# Patient Record
Sex: Female | Born: 1985 | Hispanic: Yes | Marital: Single | Smoking: Never smoker
Health system: Southern US, Community
[De-identification: ages and names within clinical notes are randomized; demographics above are authoritative.]

## PROBLEM LIST (undated history)

## (undated) DIAGNOSIS — R87619 Unspecified abnormal cytological findings in specimens from cervix uteri: Secondary | ICD-10-CM

## (undated) HISTORY — PX: COLPOSCOPY: SHX161

## (undated) HISTORY — DX: Unspecified abnormal cytological findings in specimens from cervix uteri: R87.619

---

## 2009-07-24 ENCOUNTER — Emergency Department: Payer: Self-pay | Admitting: Emergency Medicine

## 2010-06-06 ENCOUNTER — Emergency Department: Payer: Self-pay | Admitting: Emergency Medicine

## 2010-06-07 ENCOUNTER — Emergency Department: Payer: Self-pay | Admitting: Unknown Physician Specialty

## 2011-09-08 IMAGING — CT CT HEAD WITHOUT CONTRAST
2 series · 16 of 30 positions shown, 20 images · non-contrast
Comparison: none

REASON FOR EXAM: trauma
COMMENTS:

PROCEDURE:     CT  - CT HEAD WITHOUT CONTRAST  - June 06, 2010  [DATE]
RESULT:     Comparison:  None
TECHNIQUE: Multiple axial images from the foramen magnum to the vertex were
obtained without IV contrast.

[Series 2: without · axial · non-contrast · 0.40mm/px · z∈[+462,+582]mm · 13 of 30 slices shown, 17 images]
[im 3/30  brain]
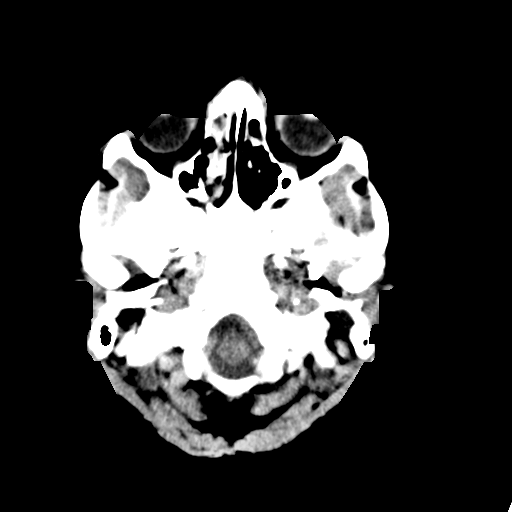
[im 3/30  bone]
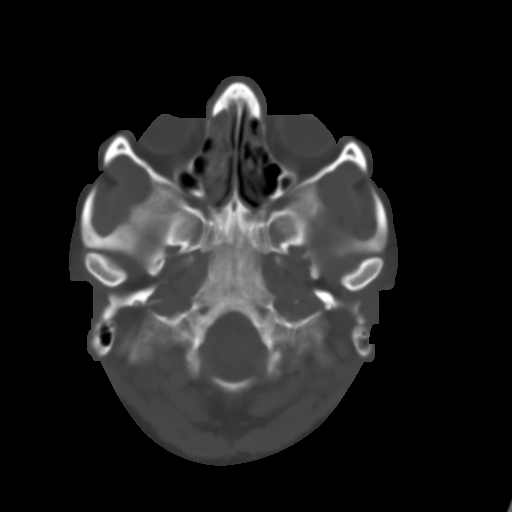
[im 5/30  brain]
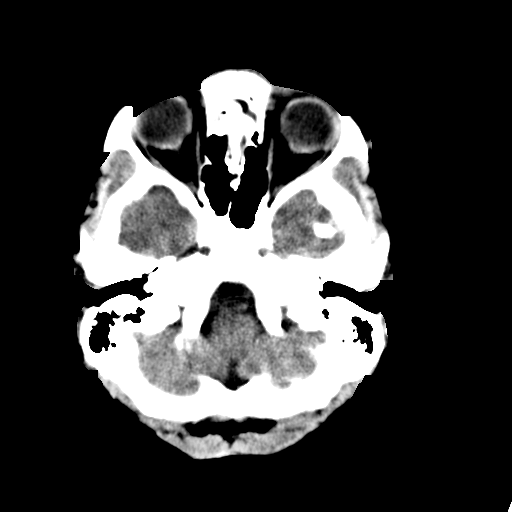
[im 7/30  brain]
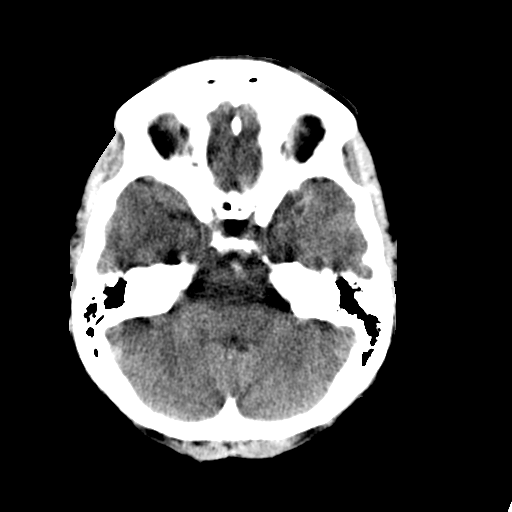
[im 9/30  brain]
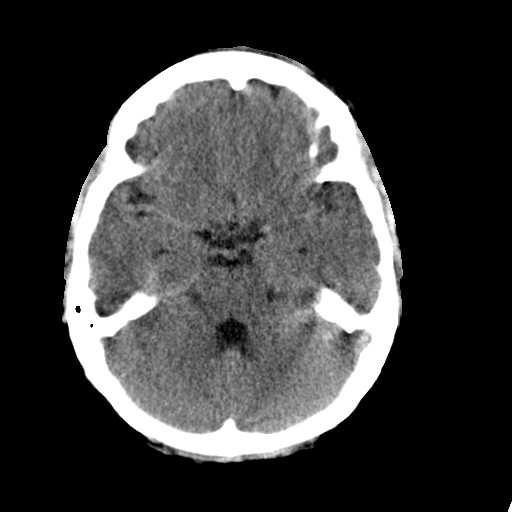
[im 11/30  brain]
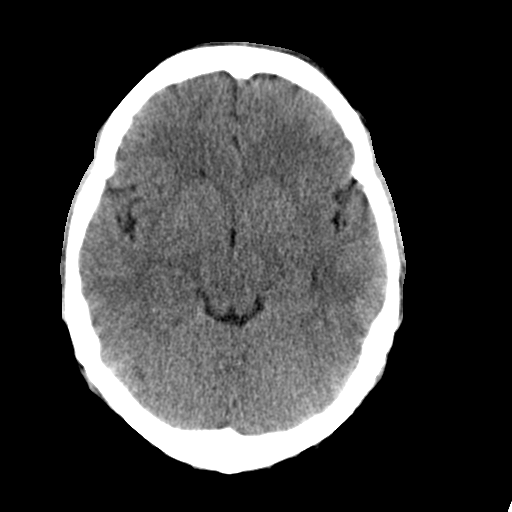
[im 11/30  bone]
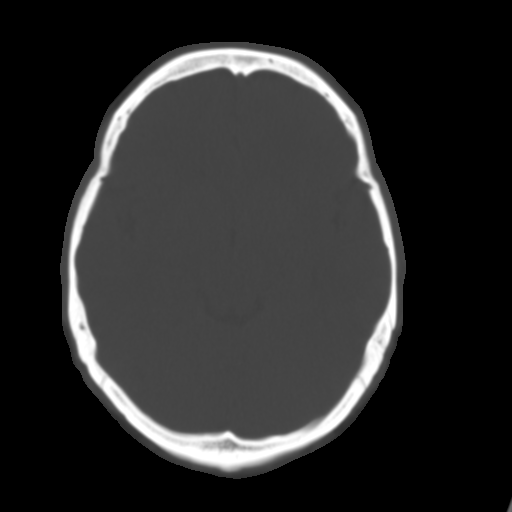
[im 13/30  brain]
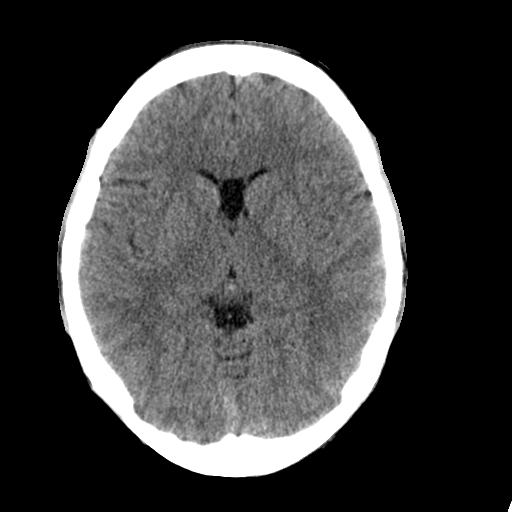
[im 15/30  brain]
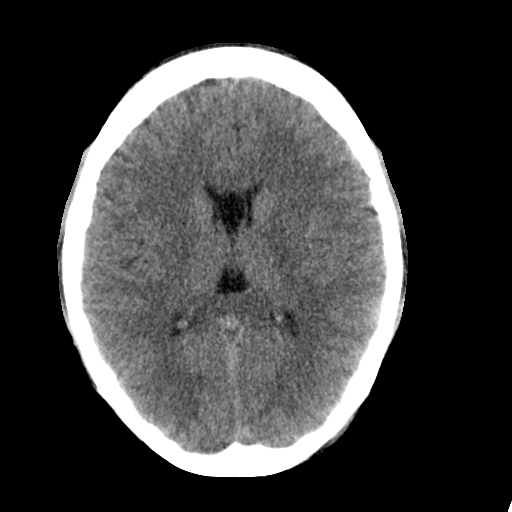
[im 17/30  brain]
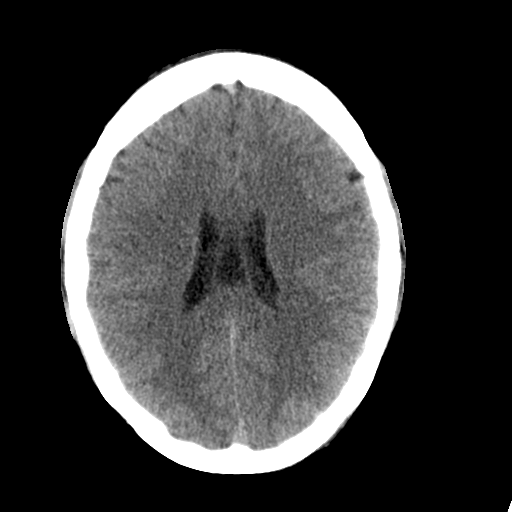
[im 19/30  brain]
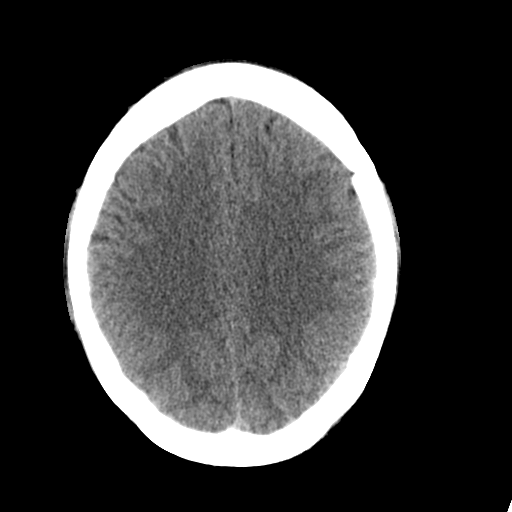
[im 19/30  bone]
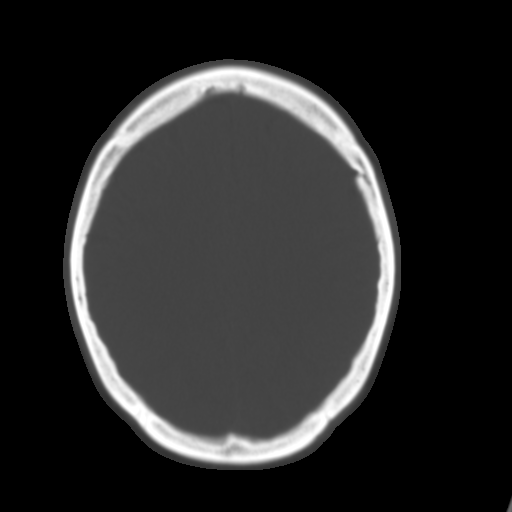
[im 21/30  brain]
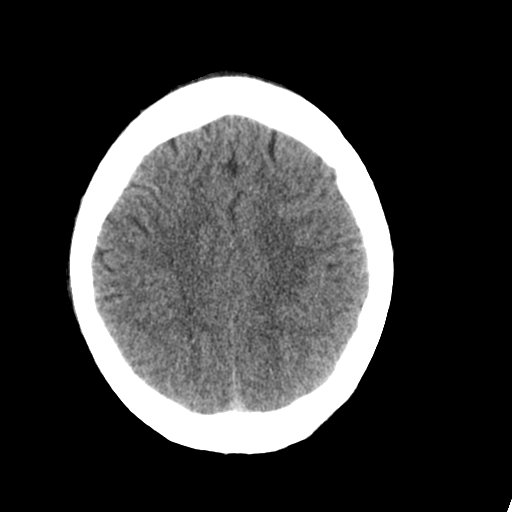
[im 23/30  brain]
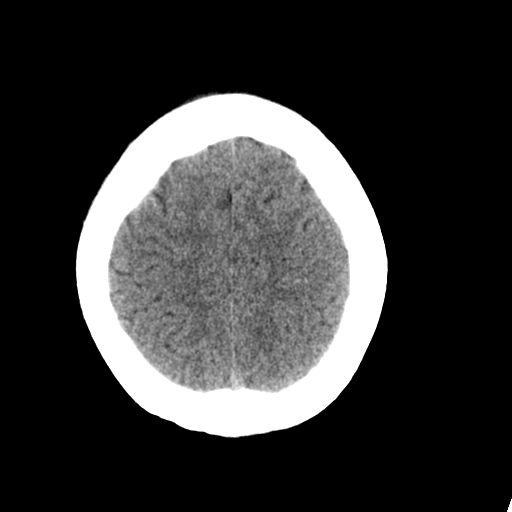
[im 25/30  brain]
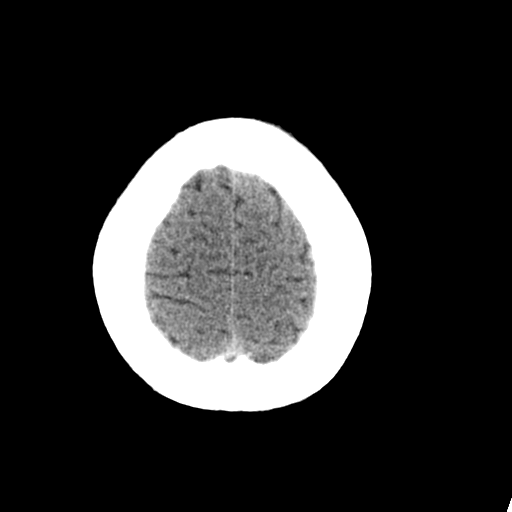
[im 27/30  brain]
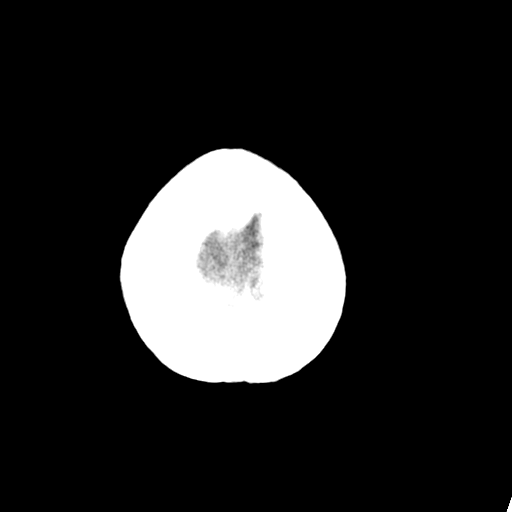
[im 27/30  bone]
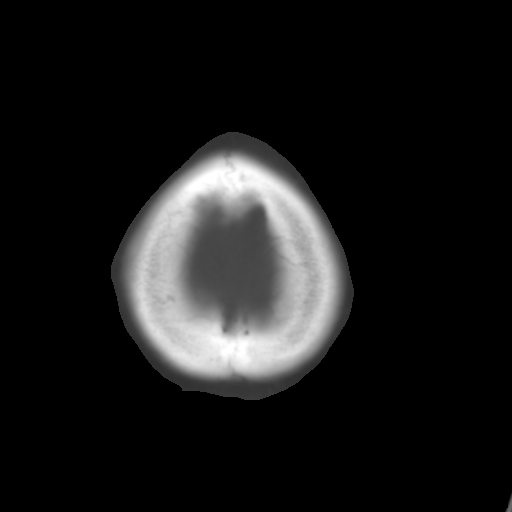

[Series 3: bone · axial · 0.40mm/px · z∈[+462,+502]mm · 3 of 30 slices shown]
[im 3/30  bone]
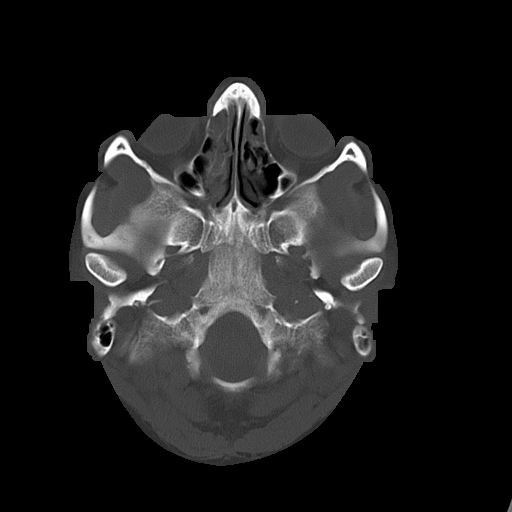
[im 7/30  bone]
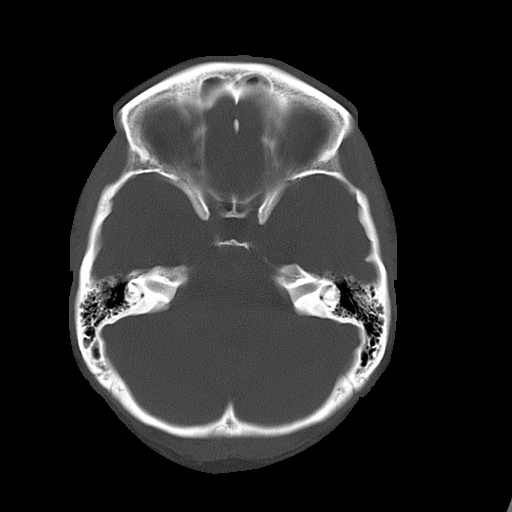
[im 11/30  bone]
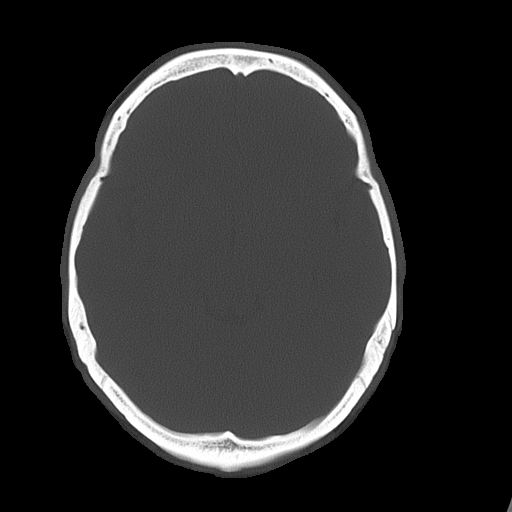

[16 of 30 positions shown; findings below may reference images not displayed]

FINDINGS: There is no evidence of mass effect, midline shift, or extra-axial fluid
collections.  There is no evidence of a space-occupying lesion or
intracranial hemorrhage. There is no evidence of a cortical-based area of
acute infarction.

The ventricles and sulci are appropriate for the patient's age. The basal
cisterns are patent.

Visualized portions of the orbits are unremarkable. There is bilateral
maxillary sinus , ethmoid sinus and frontal sinus mucosal thickening.

The osseous structures are unremarkable. There is a frontal scalp contusion.
IMPRESSION: No acute intracranial process.

Sinus disease as described above.

## 2014-02-12 ENCOUNTER — Observation Stay: Payer: Self-pay | Admitting: Obstetrics and Gynecology

## 2014-02-12 LAB — PIH PROFILE
Anion Gap: 11 (ref 7–16)
BUN: 6 mg/dL — ABNORMAL LOW (ref 7–18)
CHLORIDE: 106 mmol/L (ref 98–107)
Calcium, Total: 8.4 mg/dL — ABNORMAL LOW (ref 8.5–10.1)
Co2: 24 mmol/L (ref 21–32)
Creatinine: 0.54 mg/dL — ABNORMAL LOW (ref 0.60–1.30)
GLUCOSE: 118 mg/dL — AB (ref 65–99)
HCT: 27.4 % — AB (ref 35.0–47.0)
HGB: 9 g/dL — AB (ref 12.0–16.0)
MCH: 28.3 pg (ref 26.0–34.0)
MCHC: 32.9 g/dL (ref 32.0–36.0)
MCV: 86 fL (ref 80–100)
Osmolality: 280 (ref 275–301)
Platelet: 222 10*3/uL (ref 150–440)
Potassium: 3.9 mmol/L (ref 3.5–5.1)
RBC: 3.19 10*6/uL — ABNORMAL LOW (ref 3.80–5.20)
RDW: 14 % (ref 11.5–14.5)
SGOT(AST): 18 U/L (ref 15–37)
Sodium: 141 mmol/L (ref 136–145)
Uric Acid: 3.2 mg/dL (ref 2.6–6.0)
WBC: 8.2 10*3/uL (ref 3.6–11.0)

## 2014-02-12 LAB — PROTEIN / CREATININE RATIO, URINE
Creatinine, Urine: 106.8 mg/dL (ref 30.0–125.0)
PROTEIN, RANDOM URINE: 47 mg/dL — AB (ref 0–12)
PROTEIN/CREAT. RATIO: 440 mg/g{creat} — AB (ref 0–200)

## 2014-02-22 LAB — PIH PROFILE
Anion Gap: 7 (ref 7–16)
BUN: 6 mg/dL — ABNORMAL LOW (ref 7–18)
Calcium, Total: 8.1 mg/dL — ABNORMAL LOW (ref 8.5–10.1)
Chloride: 109 mmol/L — ABNORMAL HIGH (ref 98–107)
Co2: 24 mmol/L (ref 21–32)
Creatinine: 0.55 mg/dL — ABNORMAL LOW (ref 0.60–1.30)
EGFR (African American): >60
EGFR (Non-African Amer.): >60
Glucose: 104 mg/dL — ABNORMAL HIGH (ref 65–99)
HCT: 27.5 % — AB (ref 35.0–47.0)
HGB: 8.9 g/dL — ABNORMAL LOW (ref 12.0–16.0)
MCH: 27.7 pg (ref 26.0–34.0)
MCHC: 32.4 g/dL (ref 32.0–36.0)
MCV: 86 fL (ref 80–100)
Osmolality: 277 (ref 275–301)
Platelet: 219 10*3/uL (ref 150–440)
Potassium: 3.5 mmol/L (ref 3.5–5.1)
RBC: 3.22 10*6/uL — ABNORMAL LOW (ref 3.80–5.20)
RDW: 14.4 % (ref 11.5–14.5)
SGOT(AST): 22 U/L (ref 15–37)
Sodium: 140 mmol/L (ref 136–145)
Uric Acid: 4.4 mg/dL (ref 2.6–6.0)
WBC: 7.3 10*3/uL (ref 3.6–11.0)

## 2014-02-22 LAB — PROTEIN / CREATININE RATIO, URINE
Creatinine, Urine: 67.3 mg/dL (ref 30.0–125.0)
PROTEIN, RANDOM URINE: 134 mg/dL — AB (ref 0–12)
Protein/Creat. Ratio: 1991 mg/gCREAT — ABNORMAL HIGH (ref 0–200)

## 2014-02-24 LAB — PROTEIN, URINE, 24 HOUR
COLLECTION HOURS: 24 h
PROTEIN, URINE: 194 mg/dL (ref 0–12)
Protein, 24 Hour Urine: 2619 mg/24HR — ABNORMAL HIGH (ref 30–149)
Total Volume: 1350 mL

## 2014-02-25 LAB — PIH PROFILE
ANION GAP: 10 (ref 7–16)
BUN: 7 mg/dL (ref 7–18)
Calcium, Total: 8.5 mg/dL (ref 8.5–10.1)
Chloride: 105 mmol/L (ref 98–107)
Co2: 22 mmol/L (ref 21–32)
Creatinine: 0.52 mg/dL — ABNORMAL LOW (ref 0.60–1.30)
EGFR (African American): >60
EGFR (Non-African Amer.): >60
GLUCOSE: 107 mg/dL — AB (ref 65–99)
HCT: 27 % — AB (ref 35.0–47.0)
HGB: 8.5 g/dL — AB (ref 12.0–16.0)
MCH: 27 pg (ref 26.0–34.0)
MCHC: 31.3 g/dL — ABNORMAL LOW (ref 32.0–36.0)
MCV: 86 fL (ref 80–100)
Osmolality: 272 (ref 275–301)
Platelet: 224 10*3/uL (ref 150–440)
Potassium: 4 mmol/L (ref 3.5–5.1)
RBC: 3.14 10*6/uL — ABNORMAL LOW (ref 3.80–5.20)
RDW: 14.5 % (ref 11.5–14.5)
SGOT(AST): 14 U/L — ABNORMAL LOW (ref 15–37)
SODIUM: 137 mmol/L (ref 136–145)
Uric Acid: 3.5 mg/dL (ref 2.6–6.0)
WBC: 11.4 10*3/uL — ABNORMAL HIGH (ref 3.6–11.0)

## 2014-02-25 LAB — LACTATE DEHYDROGENASE: LDH: 204 U/L (ref 81–246)

## 2014-02-26 ENCOUNTER — Inpatient Hospital Stay: Payer: Self-pay | Admitting: Obstetrics and Gynecology

## 2014-02-26 LAB — SGOT (AST)(ARMC): SGOT(AST): 17 U/L (ref 15–37)

## 2014-02-26 LAB — BASIC METABOLIC PANEL
Anion Gap: 9 (ref 7–16)
BUN: 8 mg/dL (ref 7–18)
CREATININE: 0.49 mg/dL — AB (ref 0.60–1.30)
Calcium, Total: 8.5 mg/dL (ref 8.5–10.1)
Chloride: 104 mmol/L (ref 98–107)
Co2: 24 mmol/L (ref 21–32)
EGFR (African American): >60
GLUCOSE: 85 mg/dL (ref 65–99)
OSMOLALITY: 271 (ref 275–301)
Potassium: 4.3 mmol/L (ref 3.5–5.1)
SODIUM: 137 mmol/L (ref 136–145)

## 2014-02-26 LAB — IRON AND TIBC
Iron Bind.Cap.(Total): 504 ug/dL — ABNORMAL HIGH (ref 250–450)
Iron Saturation: 11 %
Iron: 55 ug/dL (ref 50–170)
Unbound Iron-Bind.Cap.: 449 ug/dL

## 2014-02-26 LAB — WBC: WBC: 8.1 10*3/uL (ref 3.6–11.0)

## 2014-02-26 LAB — RBC: RBC: 3.37 10*6/uL — AB (ref 3.80–5.20)

## 2014-02-26 LAB — PLATELET COUNT: PLATELETS: 220 10*3/uL (ref 150–440)

## 2014-02-26 LAB — FERRITIN: Ferritin (ARMC): 5 ng/mL — ABNORMAL LOW (ref 8–388)

## 2014-02-26 LAB — HEMATOCRIT: HCT: 29.3 % — ABNORMAL LOW (ref 35.0–47.0)

## 2014-02-26 LAB — HEMOGLOBIN: HGB: 9.3 g/dL — AB (ref 12.0–16.0)

## 2014-02-26 LAB — URIC ACID: URIC ACID: 3.3 mg/dL (ref 2.6–6.0)

## 2014-02-28 LAB — COMPREHENSIVE METABOLIC PANEL
ALBUMIN: 2.2 g/dL — AB (ref 3.4–5.0)
Alkaline Phosphatase: 122 U/L — ABNORMAL HIGH
Anion Gap: 6 — ABNORMAL LOW (ref 7–16)
BUN: 10 mg/dL (ref 7–18)
Bilirubin,Total: 0.1 mg/dL — ABNORMAL LOW (ref 0.2–1.0)
CO2: 27 mmol/L (ref 21–32)
CREATININE: 0.55 mg/dL — AB (ref 0.60–1.30)
Calcium, Total: 8.7 mg/dL (ref 8.5–10.1)
Chloride: 105 mmol/L (ref 98–107)
EGFR (Non-African Amer.): >60
GLUCOSE: 123 mg/dL — AB (ref 65–99)
Osmolality: 276 (ref 275–301)
Potassium: 3.9 mmol/L (ref 3.5–5.1)
SGOT(AST): 18 U/L (ref 15–37)
SGPT (ALT): 22 U/L
SODIUM: 138 mmol/L (ref 136–145)
Total Protein: 6.1 g/dL — ABNORMAL LOW (ref 6.4–8.2)

## 2014-02-28 LAB — CBC WITH DIFFERENTIAL/PLATELET
Basophil #: 0 10*3/uL (ref 0.0–0.1)
Basophil %: 0.4 %
EOS PCT: 1.2 %
Eosinophil #: 0.1 10*3/uL (ref 0.0–0.7)
HCT: 31.7 % — ABNORMAL LOW (ref 35.0–47.0)
HGB: 10.3 g/dL — AB (ref 12.0–16.0)
LYMPHS ABS: 1.9 10*3/uL (ref 1.0–3.6)
Lymphocyte %: 21.5 %
MCH: 28.1 pg (ref 26.0–34.0)
MCHC: 32.6 g/dL (ref 32.0–36.0)
MCV: 86 fL (ref 80–100)
MONOS PCT: 4.7 %
Monocyte #: 0.4 x10 3/mm (ref 0.2–0.9)
NEUTROS ABS: 6.5 10*3/uL (ref 1.4–6.5)
NEUTROS PCT: 72.2 %
PLATELETS: 219 10*3/uL (ref 150–440)
RBC: 3.68 10*6/uL — ABNORMAL LOW (ref 3.80–5.20)
RDW: 14.9 % — AB (ref 11.5–14.5)
WBC: 9 10*3/uL (ref 3.6–11.0)

## 2014-02-28 LAB — PROTEIN, URINE, 24 HOUR
COLLECTION HOURS: 24 h
Protein, 24 Hour Urine: 6250 mg/24HR — ABNORMAL HIGH (ref 30–149)
Protein, Urine: 250 mg/dL (ref 0–12)
TOTAL VOLUME C4TV(ML): 2500 mL

## 2014-03-01 LAB — CBC WITH DIFFERENTIAL/PLATELET
BASOS PCT: 0.2 %
Basophil #: 0 10*3/uL (ref 0.0–0.1)
Eosinophil #: 0.1 10*3/uL (ref 0.0–0.7)
Eosinophil %: 1.2 %
HCT: 30.1 % — ABNORMAL LOW (ref 35.0–47.0)
HGB: 9.6 g/dL — ABNORMAL LOW (ref 12.0–16.0)
Lymphocyte #: 2 10*3/uL (ref 1.0–3.6)
Lymphocyte %: 21.8 %
MCH: 27.7 pg (ref 26.0–34.0)
MCHC: 32 g/dL (ref 32.0–36.0)
MCV: 87 fL (ref 80–100)
Monocyte #: 0.6 x10 3/mm (ref 0.2–0.9)
Monocyte %: 6.4 %
NEUTROS PCT: 70.4 %
Neutrophil #: 6.5 10*3/uL (ref 1.4–6.5)
PLATELETS: 204 10*3/uL (ref 150–440)
RBC: 3.47 10*6/uL — AB (ref 3.80–5.20)
RDW: 14.7 % — AB (ref 11.5–14.5)
WBC: 9.2 10*3/uL (ref 3.6–11.0)

## 2014-03-01 LAB — COMPREHENSIVE METABOLIC PANEL
ALK PHOS: 117 U/L — AB
Albumin: 1.9 g/dL — ABNORMAL LOW (ref 3.4–5.0)
Anion Gap: 10 (ref 7–16)
BILIRUBIN TOTAL: 0.2 mg/dL (ref 0.2–1.0)
BUN: 9 mg/dL (ref 7–18)
CHLORIDE: 105 mmol/L (ref 98–107)
CREATININE: 0.48 mg/dL — AB (ref 0.60–1.30)
Calcium, Total: 8.4 mg/dL — ABNORMAL LOW (ref 8.5–10.1)
Co2: 23 mmol/L (ref 21–32)
EGFR (Non-African Amer.): >60
Glucose: 101 mg/dL — ABNORMAL HIGH (ref 65–99)
Osmolality: 275 (ref 275–301)
Potassium: 4.4 mmol/L (ref 3.5–5.1)
SGOT(AST): 19 U/L (ref 15–37)
SGPT (ALT): 21 U/L
Sodium: 138 mmol/L (ref 136–145)
TOTAL PROTEIN: 5.7 g/dL — AB (ref 6.4–8.2)

## 2014-03-02 LAB — COMPREHENSIVE METABOLIC PANEL
ALK PHOS: 123 U/L — AB
ALT: 25 U/L
ANION GAP: 9 (ref 7–16)
AST: 26 U/L (ref 15–37)
Albumin: 2 g/dL — ABNORMAL LOW (ref 3.4–5.0)
BUN: 10 mg/dL (ref 7–18)
Bilirubin,Total: 0.1 mg/dL — ABNORMAL LOW (ref 0.2–1.0)
CALCIUM: 8.4 mg/dL — AB (ref 8.5–10.1)
CO2: 23 mmol/L (ref 21–32)
CREATININE: 0.58 mg/dL — AB (ref 0.60–1.30)
Chloride: 105 mmol/L (ref 98–107)
EGFR (African American): >60
GLUCOSE: 91 mg/dL (ref 65–99)
Osmolality: 272 (ref 275–301)
Potassium: 4 mmol/L (ref 3.5–5.1)
Sodium: 137 mmol/L (ref 136–145)
Total Protein: 6.1 g/dL — ABNORMAL LOW (ref 6.4–8.2)

## 2014-03-02 LAB — CBC WITH DIFFERENTIAL/PLATELET
BASOS ABS: 0 10*3/uL (ref 0.0–0.1)
Basophil %: 0.3 %
EOS PCT: 1.4 %
Eosinophil #: 0.1 10*3/uL (ref 0.0–0.7)
HCT: 30.6 % — ABNORMAL LOW (ref 35.0–47.0)
HGB: 10 g/dL — ABNORMAL LOW (ref 12.0–16.0)
LYMPHS ABS: 2 10*3/uL (ref 1.0–3.6)
LYMPHS PCT: 23.9 %
MCH: 28.1 pg (ref 26.0–34.0)
MCHC: 32.5 g/dL (ref 32.0–36.0)
MCV: 87 fL (ref 80–100)
MONO ABS: 0.6 x10 3/mm (ref 0.2–0.9)
Monocyte %: 7 %
NEUTROS ABS: 5.5 10*3/uL (ref 1.4–6.5)
NEUTROS PCT: 67.4 %
PLATELETS: 214 10*3/uL (ref 150–440)
RBC: 3.54 10*6/uL — AB (ref 3.80–5.20)
RDW: 14.9 % — ABNORMAL HIGH (ref 11.5–14.5)
WBC: 8.2 10*3/uL (ref 3.6–11.0)

## 2014-03-03 LAB — CBC WITH DIFFERENTIAL/PLATELET
BASOS PCT: 0.3 %
Basophil #: 0 10*3/uL (ref 0.0–0.1)
Basophil #: 0 10*3/uL (ref 0.0–0.1)
Basophil %: 0.2 %
EOS ABS: 0.1 10*3/uL (ref 0.0–0.7)
EOS ABS: 0.1 10*3/uL (ref 0.0–0.7)
EOS PCT: 1 %
Eosinophil %: 0.5 %
HCT: 31.2 % — ABNORMAL LOW (ref 35.0–47.0)
HCT: 30.5 % — AB (ref 35.0–47.0)
HGB: 10 g/dL — ABNORMAL LOW (ref 12.0–16.0)
HGB: 10.2 g/dL — ABNORMAL LOW (ref 12.0–16.0)
LYMPHS ABS: 1.7 10*3/uL (ref 1.0–3.6)
LYMPHS PCT: 15.8 %
Lymphocyte #: 1.9 10*3/uL (ref 1.0–3.6)
Lymphocyte %: 15.7 %
MCH: 28.1 pg (ref 26.0–34.0)
MCH: 28.1 pg (ref 26.0–34.0)
MCHC: 32.7 g/dL (ref 32.0–36.0)
MCHC: 32.8 g/dL (ref 32.0–36.0)
MCV: 86 fL (ref 80–100)
MCV: 86 fL (ref 80–100)
Monocyte #: 0.7 x10 3/mm (ref 0.2–0.9)
Monocyte #: 0.7 x10 3/mm (ref 0.2–0.9)
Monocyte %: 5.8 %
Monocyte %: 6.4 %
NEUTROS ABS: 8.4 10*3/uL — AB (ref 1.4–6.5)
NEUTROS ABS: 9.2 10*3/uL — AB (ref 1.4–6.5)
Neutrophil %: 77.2 %
Neutrophil %: 77.1 %
PLATELETS: 224 10*3/uL (ref 150–440)
Platelet: 215 10*3/uL (ref 150–440)
RBC: 3.62 10*6/uL — AB (ref 3.80–5.20)
RBC: 3.56 10*6/uL — AB (ref 3.80–5.20)
RDW: 15.2 % — ABNORMAL HIGH (ref 11.5–14.5)
RDW: 15 % — AB (ref 11.5–14.5)
WBC: 11.9 10*3/uL — ABNORMAL HIGH (ref 3.6–11.0)
WBC: 10.9 10*3/uL (ref 3.6–11.0)

## 2014-03-03 LAB — COMPREHENSIVE METABOLIC PANEL
ALBUMIN: 2 g/dL — AB (ref 3.4–5.0)
ALT: 31 U/L
Albumin: 2 g/dL — ABNORMAL LOW (ref 3.4–5.0)
Alkaline Phosphatase: 118 U/L — ABNORMAL HIGH
Alkaline Phosphatase: 131 U/L — ABNORMAL HIGH
Anion Gap: 12 (ref 7–16)
Anion Gap: 7 (ref 7–16)
BILIRUBIN TOTAL: 0.3 mg/dL (ref 0.2–1.0)
BUN: 7 mg/dL (ref 7–18)
BUN: 7 mg/dL (ref 7–18)
Bilirubin,Total: 0.1 mg/dL — ABNORMAL LOW (ref 0.2–1.0)
CALCIUM: 7.9 mg/dL — AB (ref 8.5–10.1)
CO2: 23 mmol/L (ref 21–32)
CREATININE: 0.63 mg/dL (ref 0.60–1.30)
Calcium, Total: 7.4 mg/dL — ABNORMAL LOW (ref 8.5–10.1)
Chloride: 103 mmol/L (ref 98–107)
Chloride: 101 mmol/L (ref 98–107)
Co2: 24 mmol/L (ref 21–32)
Creatinine: 0.59 mg/dL — ABNORMAL LOW (ref 0.60–1.30)
EGFR (African American): >60
EGFR (Non-African Amer.): >60
EGFR (Non-African Amer.): >60
GLUCOSE: 90 mg/dL (ref 65–99)
Glucose: 92 mg/dL (ref 65–99)
OSMOLALITY: 262 (ref 275–301)
Osmolality: 273 (ref 275–301)
POTASSIUM: 4.1 mmol/L (ref 3.5–5.1)
POTASSIUM: 4 mmol/L (ref 3.5–5.1)
SGOT(AST): 25 U/L (ref 15–37)
SGOT(AST): 21 U/L (ref 15–37)
SGPT (ALT): 29 U/L
Sodium: 138 mmol/L (ref 136–145)
Sodium: 132 mmol/L — ABNORMAL LOW (ref 136–145)
TOTAL PROTEIN: 6 g/dL — AB (ref 6.4–8.2)
Total Protein: 6.1 g/dL — ABNORMAL LOW (ref 6.4–8.2)

## 2014-03-04 LAB — COMPREHENSIVE METABOLIC PANEL
Albumin: 1.6 g/dL — ABNORMAL LOW (ref 3.4–5.0)
Alkaline Phosphatase: 103 U/L
Anion Gap: 8 (ref 7–16)
BUN: 8 mg/dL (ref 7–18)
Bilirubin,Total: 0.3 mg/dL (ref 0.2–1.0)
CALCIUM: 7.5 mg/dL — AB (ref 8.5–10.1)
Chloride: 102 mmol/L (ref 98–107)
Co2: 25 mmol/L (ref 21–32)
Creatinine: 0.64 mg/dL (ref 0.60–1.30)
GLUCOSE: 94 mg/dL (ref 65–99)
Osmolality: 268 (ref 275–301)
Potassium: 4.8 mmol/L (ref 3.5–5.1)
SGOT(AST): 30 U/L (ref 15–37)
SGPT (ALT): 28 U/L
SODIUM: 135 mmol/L — AB (ref 136–145)
Total Protein: 5.1 g/dL — ABNORMAL LOW (ref 6.4–8.2)

## 2014-03-04 LAB — CBC WITH DIFFERENTIAL/PLATELET
Basophil #: 0 10*3/uL (ref 0.0–0.1)
Basophil %: 0.2 %
EOS PCT: 0.5 %
Eosinophil #: 0 10*3/uL (ref 0.0–0.7)
HCT: 29.2 % — ABNORMAL LOW (ref 35.0–47.0)
HGB: 9.7 g/dL — ABNORMAL LOW (ref 12.0–16.0)
LYMPHS ABS: 1.7 10*3/uL (ref 1.0–3.6)
Lymphocyte %: 17.2 %
MCH: 28.6 pg (ref 26.0–34.0)
MCHC: 33.3 g/dL (ref 32.0–36.0)
MCV: 86 fL (ref 80–100)
MONO ABS: 0.7 x10 3/mm (ref 0.2–0.9)
MONOS PCT: 7.1 %
Neutrophil #: 7.4 10*3/uL — ABNORMAL HIGH (ref 1.4–6.5)
Neutrophil %: 75 %
Platelet: 205 10*3/uL (ref 150–440)
RBC: 3.4 10*6/uL — AB (ref 3.80–5.20)
RDW: 15.5 % — ABNORMAL HIGH (ref 11.5–14.5)
WBC: 9.8 10*3/uL (ref 3.6–11.0)

## 2014-03-08 LAB — PATHOLOGY REPORT

## 2014-10-19 NOTE — Consult Note (Signed)
Referral Information:  Reason for Referral 29 yo gravida 3 para 2002 at 49w0dgestation by LMP and 129w5dSKoreas referred by her inpatient team for consultation regarding further management of her preeclampsia.  [facility]   Referring Physician [facility]   Prenatal Hx Uncomplicated PN course until two weeks ago when she developed swelling.  Her BP on 02/12/2014 was normotensive but higher than it had been previously (124/76), and she now had 2+ protein.  On 02/22/2014, she presented to the [facility] with a BP of  140/90 and 3+ protein.  Dr[facility] JaGlennon Macas [facility].  Since admission, she has denied any preeclampsia symptoms, although today she says her distance vision is blurry.  Her BPs have ranged from  131-149/79-88 since admission  A 24 hour urine for protein resulted on 02/24/2014 was 2619 mg/24 hours.  SGOT was normal today, creatinine was 0.52.  Her CBCs have been normal.  Her last platelet count was  224,000 today.  She has received betamethasone x 2.   Past Obstetrical Hx 1.01/22/2003 [place] with preeclampsia.   2. 06/[facility]   The father of this fetus is not the same as the father of her other children   Home Medications: Medication Instructions Status  PreNata Prenatal Multivitamins oral tablet 1 tab(s) orally once a day Active   Allergies:   Aspirin: Hives  Vital Signs/Notes:  Nursing Vital Signs: **Vital Signs.:   31-Aug-15 04:15  Vital Signs Type Routine  Temperature Temperature (F) 98  Celsius 36.6  Temperature Source oral  Pulse Pulse 66  Respirations Respirations 16  Systolic BP Systolic BP 13403Diastolic BP (mmHg) Diastolic BP (mmHg) 82  Mean BP 98  Pulse Ox % Pulse Ox % 98  Pulse Ox Activity Level  At rest  Oxygen  Delivery Room Air/ 21 %    08:23  Vital Signs Type Routine  Temperature Temperature (F) 98  Celsius 36.6  Temperature Source oral  Pulse Pulse 70  Respirations Respirations 18  Systolic BP Systolic BP 14474Diastolic BP (mmHg) Diastolic BP (mmHg) 88  Mean BP 108  BP Source  if not from Vital Sign Device non-invasive  Pulse Ox % Pulse Ox % 97  Pulse Ox Activity Level  At rest  Oxygen Delivery Room Air/ 21 %  Fetal Heart Tones  144    11:52  Vital Signs Type Routine  Temperature Temperature (F) 98  Celsius 36.6  Temperature Source oral  Pulse Pulse 73  Respirations Respirations 18  Systolic BP Systolic BP 13259Diastolic BP (mmHg) Diastolic BP (mmHg) 82  Mean BP 99  BP Source  if not from Vital Sign Device non-invasive  Pulse Ox % Pulse Ox % 98  Pulse Ox Activity Level  At rest  Oxygen Delivery Room Air/ 21 %    16:19  Vital Signs Type Routine  Temperature Temperature (F) 98.6  Celsius 37  Temperature Source oral  Pulse Pulse 65  Respirations Respirations 16  Systolic BP Systolic BP 16563Diastolic BP (mmHg) Diastolic BP (mmHg) 99  Mean BP 119  BP Source  if not from Vital Sign Device non-invasive  Pulse Ox % Pulse Ox % 96  Pulse Ox Activity Level  At rest  Oxygen Delivery Room Air/ 21 %   Perinatal Consult:  LMP 09-Jul-2013   Past Medical History cont'd Asthma - uses inhaler prn   PSurg Hx None   FHx MGM with diabetes   Occupation Father unemployed   Soc Hx single, 2 children [place] in [place] with [place]:  Subjective As per HPI   Fever/Chills No   Cough No   Abdominal Pain No   Diarrhea No   Constipation No   Nausea/Vomiting No   SOB/DOE No   Chest Pain No   Dysuria No   Tolerating Diet No   Medications/Allergies Reviewed Medications/Allergies reviewed   Exam:  Today's Weight 187lb; BMI=34     Hepatic:  31-Aug-15 04:26   SGOT (AST)  14 (Result(s) reported on 25 Feb 2014 at 05:22AM.)  LabUnknown:  30-Aug-15 08:58    C4-UPROT 194  Routine Chem:  31-Aug-15 04:26   Uric Acid, Serum 3.5 (Result(s) reported on 25 Feb 2014 at 05:22AM.)  Glucose, Serum  107  BUN 7  Creatinine (comp)  0.52  Sodium, Serum 137  Potassium, Serum 4.0  Chloride, Serum 105  CO2, Serum 22  Osmolality (calc) 272  Anion Gap 10  Calcium (Total), Serum 8.5  eGFR (Non-African American) >60 (eGFR values <21m/min/1.73 m2 may be an indication of chronic kidney disease (CKD). Calculated eGFR is useful in patients with stable renal function. The eGFR calculation will not be reliable in acutely ill patients when serum creatinine is changing rapidly. It is not useful in  patients on dialysis. The eGFR calculation may not be applicable to patients at the low and high extremes of body sizes, pregnant women, and vegetarians.)  eGFR (African American) >60  LDH, Serum 204 (Result(s) reported on 25 Feb 2014 at 05:24AM.)  Misc Urine Chem:  30-Aug-15 08:58   Total Volume, Urine 1350  Collection Hours, Urine 24  Protein, 24 Hr Urine  2619 (Result(s) reported on 24 Feb 2014 at 09:50AM.)  Routine Hem:  31-Aug-15 04:26   WBC (CBC)  11.4  RBC (CBC)  3.14  Hemoglobin (CBC)  8.5  Hematocrit (CBC)  27.0  Platelet Count (CBC) 224 (Result(s) reported on 25 Feb 2014 at 05:22AM.)  MCV 86  MCH 27.0  MCHC  31.3  RDW 14.5    Additional Lab/Radiology Notes [place] - cephalic, EFW 16270g, 235%KKXF AFI = 12.2 cm   Impression/Recommendations:  Impression 29yo gravida 3 para 2002 at 358w0destation by LMP and 1562w5d [place] preeclampsia based on HTN and proteinuria.  Other than blurry distance vision, which is of questionable significance, she has no other signs or symptoms of preeclampsia.   Recommendations 1. Her blood pressures are sufficiently elevated above baseline and her condition has changed sufficiently over time, that I agree with inpatient management, as opposed to home bedrest. 2. Sequential compression devices antepartum and  postpartum 3. Consider postpartum thromboprophylaxis with LMWH if the patient has a cesarean or remains at bedrest > 1 week. 4. Plan delivery if she develops severe signs or symptoms - cerebral sx, pulmonary edema, SBP > 160, dias BP > 105, dropping platelets, elevated liver enzymes, rising creatinine, oliguria. 5. Otherwise, plan delivery at 37 weeks. 6. Use antihypertensives as necessary to manage BP acutely, but do not plan to treat BP chronically.  If patient requires antihypertensives, she likely needs to be delivered for worsening/uncontrolled BP. 7. Notify NICU and request they meet with patient. 8. [facility] will be available for further consultation  as requested. 9. The above recommendations were communicated to Dr. Ilda Basset. 10.  The patient was counseled via the interpreter regarding our recommendatiosn.   Plan:  Comment/Plan Thank you for allowing [place] to participate in her care    Total Time Spent with Patient 60 minutes   >50% of visit spent in couseling/coordination of care yes   Office Use Only [facility] Detailed (55 min)   Coding Description: MATERNAL CONDITIONS/HISTORY INDICATION(S).   Pre-eclampsia, mild.  Electronic Signatures: Dellia Nims (MD)  (Signed 31-Aug-15 18:28)  Authored: Referral, Home Medications, Allergies, Vital Signs/Notes, Consult, Exam, Lab, Lab/Radiology Notes, Impression, Plan, Billing, Coding Description   Last Updated: 31-Aug-15 18:28 by Dellia Nims (MD)

## 2014-10-19 NOTE — Consult Note (Signed)
   Maternal Age 29   Gravida 3   Para 2   Term Deliveries 2   Preterm Deliveries 0   Abortions 0   Living Children 2   Final EDD (dd-mmm-yy) 15-Apr-2014   GA Assessment: (Weeks) 33 week(s)   (Days) 2 day(s)   Gestation Single   Blood Type (Maternal) A positive   Antibody Screen Results (Maternal) negative   HIV Results (Maternal) negative   Gonorrhea Results (Maternal) negative   Chlamydia Results (Maternal) negative   Hepatitis C Culture (Maternal) unknown   Herpes Results (Maternal) n/a   VDRL/RPR/Syphilis Results (Maternal) negative   Varicella Titer Results (Maternal) Positive   Rubella Results (Maternal) immune   Hepatitis B Surface Antigen Results (Maternal) negative   Group B Strep Results Maternal (Result >5wks must be treated as unknown) unknown/result > 5 weeks ago    Additional Comments I spoke with Mrs. Leighton Roachstrada Tinoco today to discuss possible delivery prior to 35 weeks in the setting of preeclampsia.  She is currently 33 2/7 weeks.  Infants born at this gestational age are expected to have excellent outcomes with low risk of serious morbidity or mortality.  We discussed delivery plan, including a separate neonatal team that would provide resuscitation per NRP, including possible supplemental oxygen or CPAP.  She has received betamethasone, but there remains a small chance that infant will require intubation for surfactant administration to treat respiratory distress syndrome.  We also reviewed feeding immaturity at this gestational age.  She is planning to breastfeed and can provide pumped breast milk for gavage feedings until infant is able to take adequate enteral feedings at the breast. He is at higher risk of hyperbilirubinemia due to his prematurity, and he may require supplemental heat in an isolette.  If he is delivered due to preeclampsia with no chorioamnionitis or other concerns for infection, we will defer antibiotic therapy unless his clinical  course warrants treatment.  [facility] visiting policies were reviewed.  All questions were answered.  I would be happy to reconsult if new concerns arise.  Orvan SeenAshley Sharetha Newson, MD   Electronic Signatures: Early CharsSherwood, Lew Prout L (MD)  (Signed 30-Sep-15 11:08)  Authored: PREGNANCY and LABOR, ADDITIONAL COMMENTS   Last Updated: 02-Sep-15 11:08 by Early CharsSherwood, Armstead Heiland L (MD)

## 2014-11-05 NOTE — H&P (Signed)
L&D Evaluation:  History Expanded:  HPI 29 yo G3P2002 at 27w4dby 15wk UKoreaderived [place]ymanof 04/15/2014 presenting for the second time for evalution of possible pre-eclampsia after presenting to [facility] clinic today with BP 140/90 and 3+ proteinuria.  She was sent to L&D for evaluation on 8/18 after presenting to [facility] with increased lower extremity swelling, 2+ proteinuria on urine dip, 8lbs weight gain in 11 days, and BP of 124/82.  She has a history of preeclampsia in prior pregnancy, not on ASA secondary to allergy.  She reports +FM, no LOF, no VB, no ctx, no headaches, no vision changes, no RUQ or epigastric pain.   Blood Type (Maternal) A positive   Group B Strep Results Maternal (Result >5wks must be treated as unknown) unknown/result > 5 weeks ago   Maternal HIV Negative   Maternal Syphilis Ab Nonreactive   Maternal Varicella Immune   Rubella Results (Maternal) immune   Presents with r/o preeclampsia   Patient's Medical History Asthma   Patient's Surgical History none   Medications Pre Natal Vitamins  Ferrous sulfate 3228mtab po daily, Venotlin MDI   Allergies ASA   Social History none   Family History Non-Contributory   ROS:  ROS All systems were reviewed.  HEENT, CNS, GI, GU, Respiratory, CV, Renal and Musculoskeletal systems were found to be normal.   Exam:  Vital Signs stable  T98.6, BP 112-161 (most in 140s)/60s-90s, , P60-70s, RR16   Urine Protein P/C ratio 1991   General no apparent distress   Mental Status clear   Chest no increased work of breathing   Heart normal sinus rhythm   Abdomen gravid, non-tender   Estimated Fetal Weight Average for gestational age   Back no CVAT   Edema 1+   Reflexes 2+   Mebranes Intact   FHT 135, moderate, positive accels (15x15), no decels   Ucx absent   Impression:  Impression G3P2002 at 3263w4dstational age with preeclampsia without severe features   Plan:  Comments 1) R/O Preeclampsia - Nearly all blood  pressures in mild range, except for a single 161811stolic.  P/C ratio of 1991, which is up from 440 on 8/18.  No neurologic symptoms, remainder of labs normal.   - Given acceleration of BP over past week will give betamethasone today and tomorrow.  Will continue to monitor overnight and decide on disposition tomorrow. - Growth scan and AFI today.  2) Fetus - reactive, category I tracing  3) PNL A pos / ABSC neg / RI / VZI / HIV neg / RPR NR / HBsAg neg / negative second trimester screen (echogenic focus on anatomy scan) / I do not have her 1-hr results from 28 weeks but note made of 1 abnormal 3-hr value  4) Asthma - no hemabate in setting of postpartum hemorrhage  5) TDAP  up to date  6) Disposition - pending clinical course.   Labs:  Lab Results:  Hepatic:  28-Aug-15 14:34   SGOT (AST) 22 (Result(s) reported on 22 Feb 2014 at 03:00PM.)  Routine Chem:  28-Aug-15 14:34   Uric Acid, Serum 4.4 (Result(s) reported on 22 Feb 2014 at 03:00PM.)  Glucose, Serum  104  BUN  6  Creatinine (comp)  0.55  Sodium, Serum 140  Potassium, Serum 3.5  Chloride, Serum  109  CO2, Serum 24  Calcium (Total), Serum  8.1  Anion Gap 7  Osmolality (calc) 277  eGFR (African American) >60  eGFR (Non-African American) >60 (eGFR values <  67m/min/1.73 m2 may be an indication of chronic kidney disease (CKD). Calculated eGFR is useful in patients with stable renal function. The eGFR calculation will not be reliable in acutely ill patients when serum creatinine is changing rapidly. It is not useful in  patients on dialysis. The eGFR calculation may not be applicable to patients at the low and high extremes of body sizes, pregnant women, and vegetarians.)  Misc Urine Chem:  28-Aug-15 14:18   Protein/Creat Ratio (comp)  1991 (Result(s) reported on 22 Feb 2014 at 03:34PM.)  Routine Hem:  28-Aug-15 14:34   RBC (CBC)  3.22  Hemoglobin (CBC)  8.9  Hematocrit (CBC)  27.5  Platelet Count (CBC) 219 (Result(s)  reported on 22 Feb 2014 at 03:00PM.)  WBC (CBC) 7.3  MCV 86  MCH 27.7  MCHC 32.4  RDW 14.4   Electronic Signatures: JWill Bonnet(MD)  (Signed 02-Sep-15 09:21)  Authored: L&D Evaluation, Labs   Last Updated: 02-Sep-15 09:21 by JWill Bonnet(MD)

## 2014-11-05 NOTE — H&P (Signed)
L&D Evaluation:  History:  HPI 29 yo G3P2002 at 6292w1d by 15wk US derived EDC of 04/15/2014 presenting for evalution of possible pre-eclampsia after presenting to [facility] with increased lower extremity swelling, 2+ proteinuria on urine dip, 8lbs weight gain in 11 days, and BP of 124/82.  She has a history of preeclampsia in prior pregnancy, not on ASA secondary to allergy.  She reports +FM, no LOF, no VB, no ctx, no headaches, no vision changes, no RUQ or epigastric pain.   Presents with r/o preeclampsia   Patient's Medical History Asthma   Patient's Surgical History none   Medications Pre Natal Vitamins  Ferrous sulfate 325mg  tab po daily, Venotlin MDI   Allergies ASA   Social History none   Family History Non-Contributory   ROS:  ROS All systems were reviewed.  HEENT, CNS, GI, GU, Respiratory, CV, Renal and Musculoskeletal systems were found to be normal.   Exam:  Vital Signs stable  114/71; 122/75; 101/55; 101/62; 106/62; 120/66; 100/55; 100/57; 119/81; 119/70   Urine Protein P/C ratio 440   General no apparent distress   Mental Status clear   Chest no increased work of breathing   Abdomen gravid, non-tender   Estimated Fetal Weight Average for gestational age   Back no CVAT   Edema 1+   FHT 145, moderate, positive accels, no decels   Ucx absent   Impression:  Impression G2P2002 at 4392w1d with isolated proteinuria   Plan:  Comments 1) R/O Preeclampsia - normotensive throughout admission.  P/C ratio fo 440.  No neurologic symptoms, remainder of labs normal.  Discussed that isolated proteinuria is not sufficient in making the diagnosis of preeclampsia based on [employer] Bulletin on "Hypertension in Pregnancy" Nov. 2013.  - Has follow up at [facility] on 02/15/2014  - provided with knee high TED hose for swelling - precautions regarding persistent headaches, vision changes which should warrant sooner presentation/re-presentation - If patient were to develop mild  range blood pressures (>140/90 but <160/110) within the next few weeks would recommend twice weekly NST, once weekly AFI, as well as growth scan.  Deliver at 2842w0d for mild preeclampsia, earlier if severe.  Since at present the patient is normotensive no additional surveillance is warranted although weekly BP check may be reasonable if continues to display isolated proteinuria.    2) Fetus - reactive, category I tracing  3) PNL A pos / ABSC neg / RI / VZI / HIV neg / RPR NR / HBsAg neg / negative second trimester screen (echogenic focus on anatomy scan) / I do not have her 1-hr results from 28 weeks but note made of 1 abnormal 3-hr value  4) Asthma - no methergine in setting of postpartum hemorrhage  5) TDAP  up to date  6) Disposition - Discharge home with TED hose and follow up on 02/15/14 [facility]   Electronic Signatures: Lorrene ReidStaebler, Kazoua Gossen M (MD)  (Signed 18-Aug-15 16:22)  Authored: L&D Evaluation   Last Updated: 18-Aug-15 16:22 by Lorrene ReidStaebler, Rockford Leinen M (MD)

## 2017-05-09 ENCOUNTER — Other Ambulatory Visit: Payer: Self-pay

## 2017-05-09 DIAGNOSIS — K2900 Acute gastritis without bleeding: Secondary | ICD-10-CM | POA: Insufficient documentation

## 2017-05-09 LAB — COMPREHENSIVE METABOLIC PANEL
ALK PHOS: 44 U/L (ref 38–126)
ALT: 24 U/L (ref 14–54)
ANION GAP: 10 (ref 5–15)
AST: 23 U/L (ref 15–41)
Albumin: 4.4 g/dL (ref 3.5–5.0)
BILIRUBIN TOTAL: 0.2 mg/dL — AB (ref 0.3–1.2)
BUN: 10 mg/dL (ref 6–20)
CALCIUM: 9 mg/dL (ref 8.9–10.3)
CO2: 24 mmol/L (ref 22–32)
Chloride: 103 mmol/L (ref 101–111)
Creatinine, Ser: 0.58 mg/dL (ref 0.44–1.00)
GFR calc Af Amer: >60 mL/min (ref >60)
GLUCOSE: 97 mg/dL (ref 65–99)
POTASSIUM: 3.9 mmol/L (ref 3.5–5.1)
Sodium: 137 mmol/L (ref 135–145)
TOTAL PROTEIN: 7.7 g/dL (ref 6.5–8.1)

## 2017-05-09 LAB — CBC
HEMATOCRIT: 38.5 % (ref 35.0–47.0)
HEMOGLOBIN: 13.1 g/dL (ref 12.0–16.0)
MCH: 30.6 pg (ref 26.0–34.0)
MCHC: 34 g/dL (ref 32.0–36.0)
MCV: 90.1 fL (ref 80.0–100.0)
Platelets: 279 10*3/uL (ref 150–440)
RBC: 4.28 MIL/uL (ref 3.80–5.20)
RDW: 12.4 % (ref 11.5–14.5)
WBC: 7 10*3/uL (ref 3.6–11.0)

## 2017-05-09 LAB — URINALYSIS, COMPLETE (UACMP) WITH MICROSCOPIC
BACTERIA UA: NONE SEEN
BILIRUBIN URINE: NEGATIVE
Glucose, UA: NEGATIVE mg/dL
HGB URINE DIPSTICK: NEGATIVE
KETONES UR: NEGATIVE mg/dL
LEUKOCYTES UA: NEGATIVE
NITRITE: NEGATIVE
PH: 8 (ref 5.0–8.0)
Protein, ur: NEGATIVE mg/dL
SPECIFIC GRAVITY, URINE: 1.017 (ref 1.005–1.030)

## 2017-05-09 LAB — LIPASE, BLOOD: Lipase: 27 U/L (ref 11–51)

## 2017-05-09 NOTE — ED Triage Notes (Signed)
Reports abdominal pain since 8 am today.  Became worse around 8 pm tonight.  States she vomited x 1.

## 2017-05-10 ENCOUNTER — Emergency Department
Admission: EM | Admit: 2017-05-10 | Discharge: 2017-05-10 | Disposition: A | Discharge status: Home / Self Care | Payer: Self-pay | Attending: Emergency Medicine | Admitting: Emergency Medicine

## 2017-05-10 DIAGNOSIS — R1010 Upper abdominal pain, unspecified: Secondary | ICD-10-CM

## 2017-05-10 DIAGNOSIS — K2900 Acute gastritis without bleeding: Secondary | ICD-10-CM

## 2017-05-10 LAB — POCT PREGNANCY, URINE: PREG TEST UR: NEGATIVE

## 2017-05-10 MED ORDER — GI COCKTAIL ~~LOC~~
30.0000 mL | ORAL | Status: AC
  Administered 2017-05-10: 30 mL via ORAL

## 2017-05-10 MED ORDER — FAMOTIDINE 20 MG PO TABS
40.0000 mg | ORAL_TABLET | Freq: Once | ORAL | Status: AC
  Administered 2017-05-10: 40 mg via ORAL
  Filled 2017-05-10: qty 2

## 2017-05-10 MED ORDER — METOCLOPRAMIDE HCL 10 MG PO TABS: 10.0000 mg | ORAL_TABLET | Freq: Four times a day (QID) | ORAL | 0 refills | Status: DC | PRN

## 2017-05-10 MED ORDER — FAMOTIDINE 20 MG PO TABS
ORAL_TABLET | ORAL | Status: AC
  Filled 2017-05-10: qty 1

## 2017-05-10 MED ORDER — METOCLOPRAMIDE HCL 10 MG PO TABS
ORAL_TABLET | ORAL | Status: AC
  Filled 2017-05-10: qty 1

## 2017-05-10 MED ORDER — ALUMINUM-MAGNESIUM-SIMETHICONE 200-200-20 MG/5ML PO SUSP: 30.0000 mL | Freq: Three times a day (TID) | ORAL | 0 refills | Status: DC

## 2017-05-10 MED ORDER — METOCLOPRAMIDE HCL 10 MG PO TABS
10.0000 mg | ORAL_TABLET | Freq: Once | ORAL | Status: AC
  Administered 2017-05-10: 10 mg via ORAL

## 2017-05-10 MED ORDER — FAMOTIDINE 20 MG PO TABS: 20.0000 mg | ORAL_TABLET | Freq: Two times a day (BID) | ORAL | 0 refills | Status: DC

## 2017-05-10 MED ORDER — GI COCKTAIL ~~LOC~~
ORAL | Status: AC
  Filled 2017-05-10: qty 30

## 2017-05-10 NOTE — ED Provider Notes (Signed)
[facility] Emergency Department Provider Note  ____________________________________________  Time seen: Approximately 2:43 AM  I have reviewed the triage vital signs and the nursing notes.   HISTORY  Chief Complaint Abdominal Pain  Video Spanish interpreter used throughout encounter  HPI Judy Singh is a 31 y.o. female who complains of upper abdominal pain that started gradually at about 7 AM today. Call a key and intermittent, waxing and waning throughout the day. No aggravating or alleviating factors. Associated with nausea and vomiting and decreased appetite today, not positional. No fevers chills or sweats. No trauma. No diarrhea, had a normal bowel movement at about 10 PM tonight. The pain felt like it was worsening tonight around 8 PM. Nonradiating. Moderate intensity.  She reports she has had symptoms like this in the past but they are mild and resolved on their own.     No past medical history on file. None  There are no active problems to display for this patient.    No past surgical history on file. None  Prior to Admission medications   Medication Sig Start Date End Date Taking? Authorizing Provider  aluminum-magnesium hydroxide-simethicone (MAALOX) 200-200-20 MG/5ML SUSP Take 30 mLs 4 (four) times daily -  before meals and at bedtime by mouth. 05/10/17   Sharman CheekStafford, Laketha Leopard, MD  famotidine (PEPCID) 20 MG tablet Take 1 tablet (20 mg total) 2 (two) times daily by mouth. 05/10/17   Sharman CheekStafford, Jujhar Everett, MD  metoCLOPramide (REGLAN) 10 MG tablet Take 1 tablet (10 mg total) every 6 (six) hours as needed by mouth. 05/10/17   Sharman CheekStafford, Severa Jeremiah, MD  None   Allergies Aspirin   No family history on file.  Social History Social History   Tobacco Use  . Smoking status: Not on file  Substance Use Topics  . Alcohol use: Not on file  . Drug use: Not on file  No tobacco or alcohol use  Review of Systems  Constitutional:   No fever  or chills.  ENT:   No sore throat. No rhinorrhea. Cardiovascular:   No chest pain or syncope. Respiratory:   No dyspnea or cough. Gastrointestinal:   Positive as above for upper abdominal pain and vomiting.  Musculoskeletal:   Negative for focal pain or swelling All other systems reviewed and are negative except as documented above in ROS and HPI.  ____________________________________________   PHYSICAL EXAM:  VITAL SIGNS: ED Triage Vitals  Enc Vitals Group     BP 05/09/17 2318 99/78     Pulse Rate 05/09/17 2318 93     Resp 05/09/17 2318 20     Temp 05/09/17 2318 98.2 F (36.8 C)     Temp Source 05/09/17 2318 Oral     SpO2 05/09/17 2318 99 %     Weight 05/09/17 2317 160 lb (72.6 kg)     Height 05/09/17 2317 5\' 2"  (1.575 m)     Head Circumference --      Peak Flow --      Pain Score 05/09/17 2317 9     Pain Loc --      Pain Edu? --      Excl. in GC? --     Vital signs reviewed, nursing assessments reviewed.   Constitutional:   Alert and oriented. Well appearing and in no distress. Eyes:   No scleral icterus.  EOMI. No nystagmus. No conjunctival pallor. PERRL. ENT   Head:   Normocephalic and atraumatic.   Nose:   No congestion/rhinnorhea.  Mouth/Throat:   MMM, no pharyngeal erythema. No peritonsillar mass.    Neck:   No meningismus. Full ROM. Hematological/Lymphatic/Immunilogical:   No cervical lymphadenopathy. Cardiovascular:   RRR. Symmetric bilateral radial and DP pulses.  No murmurs.  Respiratory:   Normal respiratory effort without tachypnea/retractions. Breath sounds are clear and equal bilaterally. No wheezes/rales/rhonchi. Gastrointestinal:   Soft with mild left upper quadrant tenderness. Non distended. There is no CVA tenderness.  No rebound, rigidity, or guarding. Genitourinary:   deferred Musculoskeletal:   Normal range of motion in all extremities. No joint effusions.  No lower extremity tenderness.  No edema. Neurologic:   Normal speech and  language.  Motor grossly intact. No gross focal neurologic deficits are appreciated.  Skin:    Skin is warm, dry and intact. No rash noted.  No petechiae, purpura, or bullae.  ____________________________________________    LABS (pertinent positives/negatives) (all labs ordered are listed, but only abnormal results are displayed) Labs Reviewed  COMPREHENSIVE METABOLIC PANEL - Abnormal; Notable for the following components:      Result Value   Total Bilirubin 0.2 (*)    All other components within normal limits  URINALYSIS, COMPLETE (UACMP) WITH MICROSCOPIC - Abnormal; Notable for the following components:   Color, Urine YELLOW (*)    APPearance HAZY (*)    Squamous Epithelial / LPF 0-5 (*)    All other components within normal limits  LIPASE, BLOOD  CBC  POC URINE PREG, ED   ____________________________________________   EKG    ____________________________________________    RADIOLOGY  No results found.  ____________________________________________   PROCEDURES Procedures  ____________________________________________   DIFFERENTIAL DIAGNOSIS  Gastritis, pancreatitis, cholecystitis, choledocholithiasis, viral syndrome  CLINICAL IMPRESSION / ASSESSMENT AND PLAN / ED COURSE  Pertinent labs & imaging results that were available during my care of the patient were reviewed by me and considered in my medical decision making (see chart for details).   Patient well-appearing no acute distress, presents with one-day of upper abdominal pain with vomiting. Normal vital signs in the ED. Exam is benign and reassuring, suggestive of gastritis. Labs are normal and don't show any evidence of gallstone or pancreatitis. Most likely this is GERD versus viral illness. We'll give GI cocktail, Pepcid, Reglan, trial of oral intake. If symptoms are controlled patient is suitable for outpatient follow-up. No indication for further imaging at this time. Low suspicion of STI or PID TOA  torsion or appendicitis perforation obstruction or hepatitis.   ----------------------------------------- 6:12 AM on 05/10/2017 -----------------------------------------  Patient remains calm and comfortable, sleeping after receiving an acids. Vital signs stable. Suitable for outpatient follow-up, symptomatic management.     ____________________________________________   FINAL CLINICAL IMPRESSION(S) / ED DIAGNOSES    Final diagnoses:  Pain of upper abdomen  Acute gastritis without hemorrhage, unspecified gastritis type      This SmartLink is deprecated. Use AVSMEDLIST instead to display the medication list for a patient.   Portions of this note were generated with dragon dictation software. Dictation errors may occur despite best attempts at proofreading.    Sharman CheekStafford, Merve Hotard, MD 05/10/17 [phone]

## 2017-05-10 NOTE — ED Notes (Signed)
Patient is resting comfortably. 

## 2017-05-10 NOTE — ED Notes (Signed)
Pt attempting a PO challenge. Pt denies pain at this time.

## 2017-05-10 NOTE — Discharge Instructions (Signed)
Your blood and urine tests today were unremarkable.  Take medications as prescribed to control your symptoms.  Avoid anti-inflammatory pain medications like ibuprofen, as these can make your stomach problems worse. Follow up with your primary care doctor for continued evaluation of your symptoms.

## 2017-05-10 NOTE — ED Notes (Signed)
[facility]

## 2017-05-10 NOTE — ED Notes (Signed)
AAOx3.  Skin warm and dry. NAD.  Ambulates with easy and steady gait.   

## 2017-05-10 NOTE — ED Notes (Signed)
Pt to tthe er for abdominal pain that began at 0700 above the belly button and under the ribs. It feels liek a colic. Pt took IBU and pain subside but woke her tonight worse. Pt describes it as hot coals in her abdomen. Vomit x 3, feeling chills since Saturday. Pt states she is unable to eat because she is in too much pain. Denies pain with urination and denies pain with laying down.

## 2019-10-30 NOTE — Progress Notes (Signed)
Patient pre-screened for BCCCP eligibility. Two patient identifiers used for verification that I was speaking to correct patient.  Patient to present directly to [facility] on 11/05/19 at 2:30.  Appointment information emailed to patient.

## 2019-10-31 ENCOUNTER — Other Ambulatory Visit: Payer: Self-pay

## 2019-10-31 ENCOUNTER — Ambulatory Visit: Payer: Self-pay | Attending: Oncology

## 2019-10-31 DIAGNOSIS — R87612 Low grade squamous intraepithelial lesion on cytologic smear of cervix (LGSIL): Secondary | ICD-10-CM

## 2019-11-05 ENCOUNTER — Encounter: Payer: Self-pay | Admitting: Obstetrics and Gynecology

## 2019-11-05 ENCOUNTER — Other Ambulatory Visit (HOSPITAL_COMMUNITY)
Admission: RE | Admit: 2019-11-05 | Discharge: 2019-11-05 | Disposition: A | Discharge status: Home / Self Care | Payer: Self-pay | Source: Ambulatory Visit | Attending: Obstetrics and Gynecology | Admitting: Obstetrics and Gynecology

## 2019-11-05 ENCOUNTER — Other Ambulatory Visit: Payer: Self-pay

## 2019-11-05 ENCOUNTER — Ambulatory Visit (INDEPENDENT_AMBULATORY_CARE_PROVIDER_SITE_OTHER): Payer: Self-pay | Admitting: Obstetrics and Gynecology

## 2019-11-05 VITALS — BP 122/70 | Ht 62.0 in | Wt 193.0 lb

## 2019-11-05 DIAGNOSIS — R87612 Low grade squamous intraepithelial lesion on cytologic smear of cervix (LGSIL): Secondary | ICD-10-CM

## 2019-11-05 NOTE — Progress Notes (Addendum)
Referring Provider:  BCCCP at [facility] for LGSIL, HPV+ pap smear. Also, primary referral source is Norberto Sorenson, MD, at [facility]  HPI:  Judy Singh is a 34 y.o.  [phone]  who presents today for evaluation and management of abnormal cervical cytology.    Dysplasia History:  LGSIL, HPV+ pap smear  OB History  Gravida Para Term Preterm AB Living  3 3 2 1   3   SAB TAB Ectopic Multiple Live Births          3    # Outcome Date GA Lbr Len/2nd Weight Sex Delivery Anes PTL Lv  3 Preterm           2 Term           1 Term             Past Medical History:  Diagnosis Date  . Abnormal Pap smear of cervix     Past Surgical History:  Procedure Laterality Date  . COLPOSCOPY      SOCIAL HISTORY:  Social History   Substance and Sexual Activity  Alcohol Use Never   Substance and Sexual Activity  Alcohol Use Never     Substance and Sexual Activity  Drug Use Never     History reviewed. No pertinent family history.  ALLERGIES:  Aspirin  Current Outpatient Medications on File Prior to Visit  Medication Sig Dispense Refill  . PARAGARD INTRAUTERINE COPPER IU by Intrauterine route.    Marland Kitchen aluminum-magnesium hydroxide-simethicone (MAALOX) 200-200-20 MG/5ML SUSP Take 30 mLs 4 (four) times daily -  before meals and at bedtime by mouth. (Patient not taking: Reported on 11/05/2019) 355 mL 0  . famotidine (PEPCID) 20 MG tablet Take 1 tablet (20 mg total) 2 (two) times daily by mouth. (Patient not taking: Reported on 11/05/2019) 60 tablet 0  . metoCLOPramide (REGLAN) 10 MG tablet Take 1 tablet (10 mg total) every 6 (six) hours as needed by mouth. (Patient not taking: Reported on 11/05/2019) 30 tablet 0   No current facility-administered medications on file prior to visit.    Physical Exam: -Vitals:  BP 122/70   Ht 5\' 2"  (1.575 m)   Wt 193 lb (87.5 kg)   LMP 10/15/2019   BMI 35.30 kg/m  GEN: WD, WN, NAD.  A+ O x 3, good mood and affect. ABD:   NT, ND.  Soft, no masses.  No hernias noted.   Pelvic:   Vulva: Normal appearance.  No lesions.  Vagina: No lesions or abnormalities noted.  Support: Normal pelvic support.  Urethra No masses tenderness or scarring.  Meatus Normal size without lesions or prolapse.  Cervix: See below.  Anus: Normal exam.  No lesions.  Perineum: Normal exam.  No lesions.        Bimanual   Uterus: Normal size.  Non-tender.  Mobile.  AV.  Adnexae: No masses.  Non-tender to palpation.  Cul-de-sac: Negative for abnormality.   PROCEDURE: 1.  Urine Pregnancy Test:  not done (has Paragard IUD) 2.  Colposcopy performed with 4% acetic acid after verbal consent obtained                                         -Aceto-white Lesions Location(s): diffusely posteriorly and mildly anteriorly               -Biopsy performed at 6,8, and 12  o'clock               -ECC indicated and performed: Yes.       -Biopsy sites made hemostatic with pressure and Monsel's solution   -Satisfactory colposcopy: No.    -Evidence of Invasive cervical CA :  NO  Female chaperone present for pelvic exam:   ASSESSMENT:  Judy Singh is a 34 y.o. [phone] here for LGSIL, HPV+ pap smear.  PLAN: I discussed the grading system of pap smears and HPV high risk viral types.  We will discuss and base management after colpo results return.  A hospital provided Spanish language interpreter was present throughout.      Thomasene Mohair, MD  [facility] 11/05/2019  3:19 PM   CC: Coralee Rud, RN BCCCP  [facility]   Duffy, Barnabas Lister, MD [address] [place],  [place] [address]

## 2019-11-07 LAB — SURGICAL PATHOLOGY

## 2019-11-15 ENCOUNTER — Telehealth: Payer: Self-pay | Admitting: Obstetrics and Gynecology

## 2019-11-15 NOTE — Progress Notes (Signed)
Still trying to reach the patient. I left a voicemail for her today.

## 2019-11-15 NOTE — Telephone Encounter (Signed)
Left generic VM via spanish language interpreter, Arvilla Meres, ID# [id number]  Left clinic phone number for patient to return the call.

## 2019-11-20 ENCOUNTER — Telehealth: Payer: Self-pay | Admitting: Obstetrics and Gynecology

## 2019-11-20 NOTE — Progress Notes (Signed)
For this type of conization (Cold Knife Conization instead of LEEP conization), I perform the procedure in the OR.

## 2019-11-20 NOTE — Telephone Encounter (Signed)
Left generic VM through [address] [place], [place] [address]) for patient to call the clinic.

## 2019-11-20 NOTE — Progress Notes (Signed)
I've tried again and so far have been unable to reach her.  Is your office able to assist in reaching out to the patient?  I need her to know that she needs a CKC based on her results and am happy to discuss this with her. Otherwise, I can continue reaching out until I get in touch with her.

## 2019-11-28 NOTE — Progress Notes (Signed)
Phoned patient per Dr. Edison Pace request to notify of recommendation for follow-up procedure related to abnormal cervical biopsy results.  Judy Singh left message for patient to return call.

## 2019-12-05 ENCOUNTER — Telehealth: Payer: Self-pay | Admitting: *Deleted

## 2019-12-18 ENCOUNTER — Telehealth: Payer: Self-pay | Admitting: Obstetrics and Gynecology

## 2019-12-18 NOTE — Progress Notes (Signed)
Left another voicemail using the interpreter.  Still no luck catching up with her.

## 2019-12-18 NOTE — Telephone Encounter (Signed)
[employer] interpreter: Efrain Sella [phone] Interpreter called patient.  Disconnected from interpreter.   Called again. Next [facility] interpreter Marcello Moores: [phone] Interpreter left generic VM to call back.

## 2019-12-19 NOTE — Progress Notes (Signed)
I am mailing a certified letter today.  I will let you know whether I hear back from patient.

## 2019-12-19 NOTE — Progress Notes (Signed)
Thank you :)

## 2020-01-11 ENCOUNTER — Other Ambulatory Visit: Payer: Self-pay

## 2020-01-11 ENCOUNTER — Telehealth: Payer: Self-pay | Admitting: Obstetrics and Gynecology

## 2020-01-11 ENCOUNTER — Ambulatory Visit (INDEPENDENT_AMBULATORY_CARE_PROVIDER_SITE_OTHER): Payer: Self-pay | Admitting: Obstetrics and Gynecology

## 2020-01-11 ENCOUNTER — Encounter: Payer: Self-pay | Admitting: Obstetrics and Gynecology

## 2020-01-11 VITALS — BP 122/78 | Wt 190.0 lb

## 2020-01-11 DIAGNOSIS — D069 Carcinoma in situ of cervix, unspecified: Secondary | ICD-10-CM

## 2020-01-11 NOTE — Progress Notes (Signed)
With the interpreter I discussed the results of her colposcopy and discussed my recommendation for a LEEP procedure. All questions answered. Procedure discussed in detail. Will schedule for soon.

## 2020-01-11 NOTE — Telephone Encounter (Addendum)
Per the request of Jean Rosenthal, I spoke with pt while she was in office, with interpreter present, and scheduled her for an IN OFFICE LEEP. SDJ asked that we try to find available time in the next couple of weeks.  I have scheduled pt for 01/28/20 at 3:50. Adv pt to arrive at 3:30.  Pt asked what if she was on her period at that time of procedure. I adv that ideally it would be best if she wasn't but that it can be done while having menses. I adv pt to contact us if she is having extremely heavy menses and we will adv. Pt says that she is not regular so she really doesn't know if it will be an issue.  Procedure room scheduled for 1 hr SDJ scheduled for last 40 min  Being that procedure is scheduled for end of day, I will request that SDJ not be double booked for any of his pm appts.  I will adv [name] of scheduled in office LEEP.   Conard Novak, MD  [name], [name] Surgery Booking Request  Patient Full Name: Judy Singh  MRN: [id number]  DOB: 1986/01/24  Surgeon: Thomasene Mohair, MD  Requested Surgery Date and Time: 3-4 weeks  Primary Diagnosis AND Code: CIN III  Secondary Diagnosis and Code:  Surgical Procedure: LEEP  RNFA Requested?: No  L&D Notification: No  Admission Status: in office  Length of Surgery: 45 min  Special Case Needs: No  H&P: No  Phone Interview???: No  Interpreter: Yes  Medical Clearance: No  Special Scheduling Instructions: No  Any known health/anesthesia issues, diabetes, sleep apnea, latex allergy, defibrillator/pacemaker?: No  Acuity: P2  (P1 highest, P2 delay may cause harm, P3 low, elective gyn, P4 lowest)

## 2020-01-14 NOTE — Telephone Encounter (Signed)
Noted. Supplies available. °

## 2020-01-28 ENCOUNTER — Ambulatory Visit (INDEPENDENT_AMBULATORY_CARE_PROVIDER_SITE_OTHER): Payer: Self-pay | Admitting: Obstetrics and Gynecology

## 2020-01-28 ENCOUNTER — Other Ambulatory Visit: Payer: Self-pay

## 2020-01-28 ENCOUNTER — Other Ambulatory Visit (HOSPITAL_COMMUNITY)
Admission: RE | Admit: 2020-01-28 | Discharge: 2020-01-28 | Disposition: A | Discharge status: Home / Self Care | Payer: Self-pay | Source: Ambulatory Visit | Attending: Obstetrics and Gynecology | Admitting: Obstetrics and Gynecology

## 2020-01-28 ENCOUNTER — Encounter: Payer: Self-pay | Admitting: Obstetrics and Gynecology

## 2020-01-28 VITALS — BP 122/74

## 2020-01-28 DIAGNOSIS — D069 Carcinoma in situ of cervix, unspecified: Secondary | ICD-10-CM

## 2020-01-28 NOTE — Patient Instructions (Signed)
Procedimiento de escisin electroquirrgica con asa - Cuidados posteriores Loop Electrosurgical Excision Procedure El procedimiento de escisin electroquirrgica con asa (loop electrosurgical excision procedure, LEEP) es el corte y la extirpacin (escisin) de tejido del cuello uterino. El cuello uterino es la parte inferior del tero que se abre hacia la vagina. El tejido que se retira del cuello uterino se examina para determinar si hay clulas precancerosas o cancerosas. El LEEP puede realizarse en estos casos:  Tiene sangrado anormal del cuello uterino.  Usted tiene una prueba de Papanicolaou con resultado anormal.  Si el mdico encuentra una anormalidad en su cuello uterino durante un examen plvico. El LEEP normalmente solo demora algunos minutos y suele hacerse en el consultorio del mdico. El procedimiento es seguro para las mujeres que intentan quedar embarazadas. Sin embargo, el procedimiento generalmente no se realiza durante el perodo menstrual ni durante el embarazo. Informe al mdico acerca de lo siguiente:  Cualquier alergia que tenga.  Todos los [employer], incluidos vitaminas, hierbas, gotas oftlmicas, cremas y [address] de [address].  Cualquier trastorno de la sangre que tenga.  Cualquier afeccin que tenga, incluidas las infecciones vaginales actuales o anteriores, como herpes o infecciones de transmisin sexual (ITS).  Si est embarazada o podra estarlo.  Si tiene o no sangrado Architectural technologist del procedimiento. Cules son los riesgos? En general, se trata de un procedimiento seguro. Sin embargo, pueden ocurrir complicaciones, por ejemplo:  Infeccin.  Sangrado.  Reacciones alrgicas a los medicamentos.  Cambios o formacin de cicatrices en el cuello uterino.  Aumento del riesgo de parto temprano (prematuro) en futuros embarazos. Qu ocurre antes del procedimiento?  Consulte al mdico sobre: ? Multimedia programmer o suspender los medicamentos que  toma habitualmente. Esto es muy importante si toma medicamentos para la diabetes o anticoagulantes. ? Tomar medicamentos como aspirina e ibuprofeno. Estos medicamentos pueden tener un efecto anticoagulante en la [place]. No tome estos medicamentos a menos que el mdico se lo indique. ? Tomar medicamentos de [employer], vitaminas, hierbas y suplementos.  El mdico puede recomendarle que tome analgsicos antes del procedimiento.  Pregntele al mdico si debe hacer planes para que una persona la lleve de vuelta a su casa despus del procedimiento. Qu ocurre durante el procedimiento?   Le colocarn en la vagina un instrumento llamado espculo. Esto le permitir al mdico observar el cuello uterino.  Le administrarn un medicamento para adormecer la zona (anestesia local). El medicamento se inyectar en el cuello uterino y en la zona circundante.  Le aplicarn una solucin en el cuello uterino. Esta solucin ayudar al mdico a hallar las clulas anormales que deben ser extirpadas.  Le introducirn un asa de alambre delgada en la vagina. El alambre se usar para Astronomer (Holiday representative) el tejido cervical con corriente Radio producer.  Usted puede sentir que est por desmayarse durante el procedimiento. Informe a su mdico de inmediato si se siente as.  Se extirpar el tejido cervical anormal.  Si hay vasos sanguneos abiertos, se los cauterizar para evitar el sangrado.  Aplicarn una pasta en la zona cauterizada del cuello uterino para Printmaker.  La muestra de tejido cervical se examinar bajo un microscopio. El procedimiento puede variar segn el mdico y el hospital. Qu puedo esperar despus del procedimiento? Despus del procedimiento, es comn [employer] siguientes sntomas:  Calambres abdominales leves que son similares a los Medical illustrator. Estos pueden durar hasta 1 semana.  Una pequea secrecin vaginal con sangre o con un tono rosado, incluido un sangrado  de leve a  moderado, durante 1 o 2 semanas.  Una secrecin de color oscuro que sale de la vagina. Es la pasta que se coloca en el cuello uterino para Building control surveyor. Es su responsabilidad retirar [employer] del procedimiento. Pregntele a su mdico, o a un miembro del personal del departamento donde se realice el procedimiento, cundo estarn [employer]. Siga estas instrucciones en su casa:  Tome los medicamentos de venta libre y los recetados solamente como se lo haya indicado el mdico.  Retome sus actividades normales segn lo indicado por el mdico. Pregntele al mdico qu actividades son seguras para usted.  No introduzca nada en la vagina por 2semanas despus del procedimiento o hasta que el mdico la autorice. Esto incluye los tampones, las cremas y las duchas vaginales.  No tenga sexo hasta que el mdico lo autorice.  Concurra a todas las visitas de [address] se lo haya indicado el mdico. Esto es importante. Comunquese con un mdico si:  Tiene fiebre o escalofros.  Se siente dbil de un modo que no es habitual.  Tiene un sangrado vaginal que es ms abundante o que dura ms que el ciclo menstrual normal. Un signo de esto puede ser empapar de sangre un apsito o tener sangrado con cogulos.  Comienza a Counselling psychologist secrecin vaginal con mal olor.  Tiene dolor o clicos intensos en el abdomen. Resumen  El procedimiento de escisin electroquirrgica con asa (LEEP) es la extirpacin de tejido del cuello uterino. El tejido extirpado ser examinado para detectar si tiene clulas precancerosas o cancerosas.  El LEEP normalmente solo demora algunos minutos y suele hacerse en el consultorio del mdico.  No introduzca nada en la vagina por 2semanas despus del procedimiento o hasta que el mdico la autorice. Esto incluye los tampones, las cremas y las duchas vaginales.  Concurra a todas las visitas de seguimiento como se lo haya indicado el mdico. Pregntele a su  mdico, o a un miembro del personal del departamento donde se realice el procedimiento, cundo estarn [employer]. Esta informacin no tiene Theme park manager el consejo del mdico. Asegrese de hacerle al mdico cualquier pregunta que tenga. Document Revised: 08/09/2018 Document Reviewed: 08/09/2018 Elsevier Patient Education  2020 [employer].

## 2020-01-28 NOTE — Progress Notes (Signed)
     LEEP PROCEDURE NOTE  The LEEP has been explained to the patient in detail; risks/benefits reviewed.  The risks include, but are not limited to, bleeding, infection, and the possibility of cervical stenosis or cervical incompetence.  The patient had previously been given information regarding abnormal PAP smears and their relationship to HPV.  We have discussed the natural course and history of HPV, the possibility of incomplete treatment by the LEEP, as well as the possibility of recurrence.  I have reviewed the consent form for LEEP with her, and she fully understands its contents.  We have discussed the procedure itself. I have informed her that following the LEEP she should refrain from intercourse and the use of tampons for three weeks, and that she should also expect some spotting and brown/black discharge over the next several days.  We have discussed the fact that vaginal bleeding, differentiated from spotting, is not normal and that if she should have this complication, she should contact me immediately.  The follow-up after LEEP will be PAP smears or viral typing performed at regular intervals for up to 3-5 years.  Should these all prove to be normal, she will then be back on typical cervical screening.  I have answered all of her questions, and I believe she has an adequate understanding of the LEEP, its implications, and the necessity of follow-up care.  I discussed her colpo results and explained the procedure of LEEP.  All questions were answered and she signed the consent form.    The insulated speculum was placed in her vagina after placing the patient on the table in the dorsal, supine lithotomy position.    The IUD strings were grasped and the IUD was removed without difficulty.    LEEP performed in the usual manner after reviewing the previous colpo findings and results. Using the colposcope Lugol's solution was placed to verify Lugo's positive and negative areas.  Betadine was  placed over the cervix and using the colposcope, a local injection of Lidocaine was performed for anesthesia. Under colposcopic visualization Ectocervical and then endocervical specimens obtained using the loop electrodes without difficulty.  It was labeled accordingly.  Endocervical curettings were obtained.   The base and edges of the defect was then cauterized using coagulation current using the colposcope for guidance.  Monsel's solution was then placed to ensure ongoing hemostasis.  The patient tolerated the procedure well.    Follow up in 4 weeks for post-op check and possible re-placement of a new Paragard IUD. The patient was made aware that the IUD was removed.   Thomasene Mohair, MD 01/28/2020 4:51 PM

## 2020-02-01 LAB — SURGICAL PATHOLOGY

## 2020-02-26 ENCOUNTER — Encounter: Payer: Self-pay | Admitting: Obstetrics and Gynecology

## 2020-02-26 ENCOUNTER — Ambulatory Visit (INDEPENDENT_AMBULATORY_CARE_PROVIDER_SITE_OTHER): Payer: Self-pay | Admitting: Obstetrics and Gynecology

## 2020-02-26 ENCOUNTER — Other Ambulatory Visit: Payer: Self-pay

## 2020-02-26 VITALS — BP 122/78 | Ht 64.0 in | Wt 189.0 lb

## 2020-02-26 DIAGNOSIS — Z09 Encounter for follow-up examination after completed treatment for conditions other than malignant neoplasm: Secondary | ICD-10-CM

## 2020-02-26 DIAGNOSIS — D069 Carcinoma in situ of cervix, unspecified: Secondary | ICD-10-CM

## 2020-02-26 NOTE — Progress Notes (Signed)
   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  Subjective: Eating a regular diet without difficulty. The patient is not having any pain.  Activity: normal activities of daily living.  10/05/2019: LGSIL, HPV+ pap smear 11/05/2019: Colposcopy A. CERVIX, 6 O'CLOCK, BIOPSY:  - High grade squamous intraepithelial lesion, CIN-III (severe  dysplasia/carcinoma in situ).   B. CERVIX, 8 O'CLOCK, BIOPSY:  - High grade squamous intraepithelial lesion, CIN-III (severe  dysplasia/carcinoma in situ).   C. CERVIX, 12 O'CLOCK, BIOPSY:  - Low grade squamous intraepithelial lesion, CIN-I (mild dysplasia).   D. ENDOCERVIX, CURRTAGE:  - High grade squamous intraepithelial lesion, CIN-II (moderate  dysplasia).   COMMENT:   A, B. Invasion is not identified in these biopsies.   01/28/2020: LEEP  A. ECTOCERVIX, WITH SMALL ADDITIONAL AMOUNT TAKEN AT POSTERIOR EDGE,  LEEP:  - High-grade squamous intraepithelial lesion, CIN-2 and CIN-3 (moderate  and severe dysplasia).  - Margins not involved.   B. ENDOCERVIX, LEEP:  - Benign endocervical mucosa.  - No dysplasia identified.   C. ENDOCERVIX, CURETTAGE:  - Scant benign endocervical glands.  - No dysplasia identified.    Objective: Vitals:   02/26/20 1632  BP: 122/78   Vital Signs: BP 122/78   Ht 5\' 4"  (1.626 m)   Wt 189 lb (85.7 kg)   BMI 32.44 kg/m  Constitutional: Well nourished, well developed female in no acute distress.  HEENT: normal Skin: Warm and dry.  Extremity: no edema  Abdomen: Soft, non-tender, normal bowel sounds; no bruits, organomegaly or masses.   Pelvic exam: (female chaperone present) is not limited by body habitus EGBUS: within normal limits Vagina: within normal limits and with scant blood in the vault Cervix: appears well healed. No erythema, induration, warmth, or tenderness    Assessment: 34 y.o. s/p LEEP progressing well  Plan: Patient has done well after surgery  with no apparent complications.  I have discussed the post-operative course to date, and the expected progress moving forward.  The patient understands what complications to be concerned about.  I will see the patient in routine follow up, or sooner if needed.    I reviewed her pathology results in detail and all questions answered. The entire visit with accomplished with the aid of a hospital provided medical spanish interpreter.   Discussed that she will need a pap smear in one year as her next follow up. Discussed that this is very important to prevent cervical cancer.   Activity plan: No restriction.  20, MD 02/26/2020, 4:38 PM

## 2020-03-07 NOTE — Progress Notes (Signed)
Per Dr. Jean Rosenthal, patient to have one year follow-up pap.  Scheduled for BCCCP eligibility, and pap on 02/28/21 at 10:30.  Mailed appointment information to patient.

## 2021-01-28 ENCOUNTER — Ambulatory Visit: Payer: Self-pay

## 2021-02-18 ENCOUNTER — Ambulatory Visit: Payer: Self-pay | Attending: Oncology | Admitting: *Deleted

## 2021-02-18 ENCOUNTER — Encounter: Payer: Self-pay | Admitting: *Deleted

## 2021-02-18 ENCOUNTER — Other Ambulatory Visit: Payer: Self-pay

## 2021-02-18 VITALS — BP 120/70 | HR 72 | Temp 97.4°F | Ht 62.0 in | Wt 189.8 lb

## 2021-02-18 DIAGNOSIS — Z0000 Encounter for general adult medical examination without abnormal findings: Secondary | ICD-10-CM

## 2021-02-18 NOTE — Progress Notes (Signed)
  Subjective:     Patient ID: Judy Singh, female   DOB: 1985/07/13, 35 y.o.   MRN: [id number]  HPI  BCCCP Medical History Record - 02/18/21 0934       Breast History   Provider (CBE) [facility]    Last Mammogram Never    Recent Breast Symptoms None      Breast Cancer History   Breast Cancer History No personal or family history      Previous History of Breast Problems   Breast Surgery or Biopsy None    Breast Implants N/A    BSE Done Never      Gynecological/Obstetrical History   LMP 02/12/21    Is there any chance that the client could be pregnant?  No    Age at menarche 24    Age at menopause n/a    PAP smear history Annually    Date of last PAP  10/05/19    Provider (PAP) Bernestine Amass;    Age at first live birth 59    Breast fed children Yes (type length in comments)   9 months   DES Exposure No    Cervical, Uterine or Ovarian cancer No    Family history of Cervial, Uterine or Ovarian cancer No    Hysterectomy No    Cervix removed No    Ovaries removed No    Laser/Cryosurgery --   LEEP 01/28/20  CINIII   Current method of birth control None    Current method of Estrogen/Hormone replacement None    Smoking history None               Review of Systems     Objective:   Physical Exam Genitourinary:    Exam position: Lithotomy position.     Labia:        Right: No rash, tenderness, lesion or injury.        Left: No rash, tenderness, lesion or injury.      Urethra: No prolapse or urethral pain.     Vagina: No signs of injury and foreign body. No vaginal discharge, erythema, tenderness, bleeding, lesions or prolapsed vaginal walls.     Cervix: No cervical motion tenderness, discharge, friability, lesion, erythema or cervical bleeding.     Uterus: Not deviated and not enlarged.      Adnexa:        Right: No mass.         Left: No mass.        Assessment:     35 year old Hispanic female returns to [facility] for 1 year follow up pap smear  per recommendations of Dr. Jean Rosenthal.  Last pap on 10/05/19 was LSIL, colpo on 11/05/19 was CIN111.  Patient had a LEEP procedure on 01/28/20 showing [id number].  Specimen collected for pap smear without difficulty.  Patient has been screened for eligibility.  She does not have any insurance, Medicare or Medicaid.  She also meets financial eligibility.   Risk Assessment     Risk Scores       02/18/2021   Last edited by: Scarlett Presto, RN   5-year risk: 0.1 %   Lifetime risk: 5.3 %                  Plan:     Specimen for pap sent to the lab.  Will follow up per BCCCP protocol and ASCCP guidelines.

## 2021-02-25 LAB — IGP, APTIMA HPV: HPV Aptima: NEGATIVE

## 2021-06-16 ENCOUNTER — Encounter: Payer: Self-pay | Admitting: *Deleted

## 2021-06-16 NOTE — Progress Notes (Signed)
Dicussed case with Dr. Jean Rosenthal.  He agrees that per ASCCP guidelines next pap will be due in one year.  Letter mailed to inform patient to call and set up her next pap smear appointment.  Number given to call Joellyn Quails at [phone].

## 2022-04-12 ENCOUNTER — Ambulatory Visit
Admission: EM | Admit: 2022-04-12 | Discharge: 2022-04-12 | Disposition: A | Discharge status: Home / Self Care | Payer: Self-pay | Attending: Emergency Medicine | Admitting: Emergency Medicine

## 2022-04-12 ENCOUNTER — Ambulatory Visit (INDEPENDENT_AMBULATORY_CARE_PROVIDER_SITE_OTHER): Payer: Self-pay

## 2022-04-12 DIAGNOSIS — M62838 Other muscle spasm: Secondary | ICD-10-CM

## 2022-04-12 DIAGNOSIS — M25511 Pain in right shoulder: Secondary | ICD-10-CM

## 2022-04-12 DIAGNOSIS — M7541 Impingement syndrome of right shoulder: Secondary | ICD-10-CM

## 2022-04-12 MED ORDER — PREDNISONE 10 MG (21) PO TBPK: ORAL_TABLET | ORAL | 0 refills | Status: AC

## 2022-04-12 MED ORDER — TIZANIDINE HCL 4 MG PO TABS: 4.0000 mg | ORAL_TABLET | Freq: Three times a day (TID) | ORAL | 0 refills | Status: DC | PRN

## 2022-04-12 NOTE — ED Triage Notes (Signed)
Pt c/o RT shoulder pain x2 weeks, pt reports throbbing pain, weakness. 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

## 2022-04-12 NOTE — Discharge Instructions (Addendum)
There was no evidence of arthritis or bone problems on your x-ray.  Take 1000 mg of Tylenol 3 or 4 times a day, Zanaflex 3 times a day, finish the prednisone, even if you feel better.   Heat or ice, whichever feels better. I have placed a referral to sports medicine if you are not feeling better in a week to 10 days.

## 2022-04-12 NOTE — ED Provider Notes (Signed)
HPI  SUBJECTIVE:  Judy Singh is a right-handed 35 y.o. female who presents with constant, daily, throbbing, pulsating, sharp right trapezius, shoulder pain that radiates into her fingers for the past 2 weeks.  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  She states that she cannot work secondary to the pain because she cannot lift her arm past 90 degrees.  No fevers, chest pain, shortness of breath, exertional component.  No trauma.  Discharged yesterday numbness or tingling.  She has been taking 1000 mg of Tylenol which helps temporarily and has tried rest.  Symptoms are worse with reaching overhead.  She has no past medical history of shoulder injury, diabetes, hypertension, coronary disease, MI, hypercholesterolemia.  LMP: Last month.  Denies the possibility of being pregnant.  PCP: [facility].  All history obtained through video interpreter  Past Medical History:  Diagnosis Date   Abnormal Pap smear of cervix     Past Surgical History:  Procedure Laterality Date   COLPOSCOPY      History reviewed. No pertinent family history.  Social History   Tobacco Use   Smoking status: Never   Smokeless tobacco: Never  Substance Use Topics   Alcohol use: Never   Drug use: Never    No current facility-administered medications for this encounter.  Current Outpatient Medications:    predniSONE (STERAPRED UNI-PAK 21 TAB) 10 MG (21) TBPK tablet, Dispense one 6 day pack. Take as directed with food., Disp: 21 tablet, Rfl: 0   tiZANidine (ZANAFLEX) 4 MG tablet, Take 1 tablet (4 mg total) by mouth every 8 (eight) hours as needed for muscle spasms., Disp: 30 tablet, Rfl: 0  Allergies  Allergen Reactions   Aspirin Hives     ROS  As noted in HPI.   Physical Exam  BP 115/78 (BP Location: Left Arm)   Pulse 64   Temp 98.6 F (37 C) (Oral)   Ht 5\' 2"  (1.575 m)   Wt 86.2 kg   LMP 03/21/2022 (Approximate)   SpO2 99%   BMI 34.75 kg/m   Constitutional:  Well developed, well nourished, no acute distress Eyes:  EOMI, conjunctiva normal bilaterally HENT: Normocephalic, atraumatic,mucus membranes moist Respiratory: Normal inspiratory effort Cardiovascular: Normal rate GI: nondistended skin: No rash, skin intact Musculoskeletal: R shoulder with ROM intact Drop test painful but negative, pain with abduction past 90 degrees, clavicle tender, A/C joint tender, scapula NT , proximal humerus tender, trapezius  tender with spasm, shoulder joint tender, Motor strength normal, Sensation intact LT over deltoid region, distal NVI with hand having intact sensation and strength in the median, radial, and ulnar nerve distribution.  Grip strength 5/5 and equal bilaterally.  no pain with internal rotation, pain  with external rotation, Positive  tenderness in bicipital groove, positive empty can test,  positive liftoff test, pain, but no instability with abduction/external rotation. RP 2+  Neurologic: Alert & oriented x 3, no focal neuro deficits Psychiatric: Speech and behavior appropriate   ED Course   Medications - No data to display  Orders Placed This Encounter  Procedures   DG Shoulder Right    Standing Status:   Standing    Number of Occurrences:   1    Order Specific Question:   Reason for Exam (SYMPTOM  OR DIAGNOSIS REQUIRED)    Answer:   diffuse shoudler tenderness r/o actue changes   AMB referral to sports medicine    Referral Priority:   Routine  Referral Type:   Consultation    Referral Reason:   Specialty Services Required    Referred to Provider:   Montel Culver, MD    Number of Visits Requested:   1    No results found for this or any previous visit (from the past 24 hour(s)). DG Shoulder Right  Result Date: 04/12/2022 CLINICAL DATA:  Diffuse shoulder pain and tenderness. EXAM: RIGHT SHOULDER - 2+ VIEW COMPARISON:  None Available. FINDINGS: There is no evidence of fracture or dislocation. There is no evidence of arthropathy or  other focal bone abnormality. Soft tissues are unremarkable. IMPRESSION: Negative. Electronically Signed   By: Nelson Chimes M.D.   On: 04/12/2022 10:22    ED Clinical Impression  1. Impingement syndrome of shoulder, right   2. Trapezius muscle spasm      ED Assessment/Plan     Patient with diffuse shoulder tenderness.  Her job requires a lot of repetitive activity, reaching overhead.  Concern for rotator cuff injury/impingement syndrome.  Will x-ray shoulder to rule out any acute changes.  She has no C-spine tenderness, deferring C-spine films today.  She reports facial swelling with NSAIDs.    Reviewed imaging independently.  No arthropathy, focal bone abnormality.  Normal soft tissues.  See radiology report for full details.  Will send home with Tylenol, Zanaflex, 6-day prednisone taper, advised heat or ice, whichever feels better, work note for 2 days.  Will refer to Dr. Rosette Reveal, sports medicine if not better in a week to 10 days.  Using the video interpreter, discussed imaging, MDM, treatment plan, and plan for follow-up with patient.answered all questions.  patient agrees with plan.   Spent 35 minutes obtaining H&P, reviewing imaging, charting, discussing MDM and follow-up with patient.  Meds ordered this encounter  Medications   predniSONE (STERAPRED UNI-PAK 21 TAB) 10 MG (21) TBPK tablet    Sig: Dispense one 6 day pack. Take as directed with food.    Dispense:  21 tablet    Refill:  0   tiZANidine (ZANAFLEX) 4 MG tablet    Sig: Take 1 tablet (4 mg total) by mouth every 8 (eight) hours as needed for muscle spasms.    Dispense:  30 tablet    Refill:  0      *This clinic note was created using Lobbyist. Therefore, there may be occasional mistakes despite careful proofreading.  ?    Melynda Ripple, MD 04/13/22 1047

## 2022-12-20 ENCOUNTER — Other Ambulatory Visit: Payer: Self-pay

## 2022-12-20 ENCOUNTER — Encounter: Payer: Self-pay | Admitting: Emergency Medicine

## 2022-12-20 ENCOUNTER — Emergency Department: Payer: No Typology Code available for payment source

## 2022-12-20 DIAGNOSIS — Y92410 Unspecified street and highway as the place of occurrence of the external cause: Secondary | ICD-10-CM | POA: Insufficient documentation

## 2022-12-20 DIAGNOSIS — M79671 Pain in right foot: Secondary | ICD-10-CM | POA: Diagnosis present

## 2022-12-20 DIAGNOSIS — S9031XA Contusion of right foot, initial encounter: Secondary | ICD-10-CM | POA: Insufficient documentation

## 2022-12-20 DIAGNOSIS — M545 Low back pain, unspecified: Secondary | ICD-10-CM | POA: Diagnosis not present

## 2022-12-20 LAB — COMPREHENSIVE METABOLIC PANEL
ALT: 46 U/L — ABNORMAL HIGH (ref 0–44)
AST: 29 U/L (ref 15–41)
Albumin: 4.2 g/dL (ref 3.5–5.0)
Alkaline Phosphatase: 44 U/L (ref 38–126)
Anion gap: 8 (ref 5–15)
BUN: 12 mg/dL (ref 6–20)
CO2: 24 mmol/L (ref 22–32)
Calcium: 9.3 mg/dL (ref 8.9–10.3)
Chloride: 105 mmol/L (ref 98–111)
Creatinine, Ser: 0.85 mg/dL (ref 0.44–1.00)
GFR, Estimated: >60 mL/min (ref >60)
Glucose, Bld: 100 mg/dL — ABNORMAL HIGH (ref 70–99)
Potassium: 3.9 mmol/L (ref 3.5–5.1)
Sodium: 137 mmol/L (ref 135–145)
Total Bilirubin: 0.3 mg/dL (ref 0.3–1.2)
Total Protein: 8.4 g/dL — ABNORMAL HIGH (ref 6.5–8.1)

## 2022-12-20 LAB — CBC WITH DIFFERENTIAL/PLATELET
Abs Immature Granulocytes: 0.02 10*3/uL (ref 0.00–0.07)
Basophils Absolute: 0 10*3/uL (ref 0.0–0.1)
Basophils Relative: 0 %
Eosinophils Absolute: 0.3 10*3/uL (ref 0.0–0.5)
Eosinophils Relative: 3 %
HCT: 38.3 % (ref 36.0–46.0)
Hemoglobin: 12.7 g/dL (ref 12.0–15.0)
Immature Granulocytes: 0 %
Lymphocytes Relative: 29 %
Lymphs Abs: 2.6 10*3/uL (ref 0.7–4.0)
MCH: 29.9 pg (ref 26.0–34.0)
MCHC: 33.2 g/dL (ref 30.0–36.0)
MCV: 90.1 fL (ref 80.0–100.0)
Monocytes Absolute: 0.6 10*3/uL (ref 0.1–1.0)
Monocytes Relative: 7 %
Neutro Abs: 5.5 10*3/uL (ref 1.7–7.7)
Neutrophils Relative %: 61 %
Platelets: 341 10*3/uL (ref 150–400)
RBC: 4.25 MIL/uL (ref 3.87–5.11)
RDW: 11.9 % (ref 11.5–15.5)
WBC: 9 10*3/uL (ref 4.0–10.5)
nRBC: 0 % (ref 0.0–0.2)

## 2022-12-20 LAB — URINALYSIS, ROUTINE W REFLEX MICROSCOPIC
Bilirubin Urine: NEGATIVE
Glucose, UA: NEGATIVE mg/dL
Hgb urine dipstick: NEGATIVE
Ketones, ur: NEGATIVE mg/dL
Leukocytes,Ua: NEGATIVE
Nitrite: NEGATIVE
Protein, ur: NEGATIVE mg/dL
Specific Gravity, Urine: 1.024 (ref 1.005–1.030)
pH: 5 (ref 5.0–8.0)

## 2022-12-20 LAB — POC URINE PREG, ED
Preg Test, Ur: NEGATIVE
Preg Test, Ur: NEGATIVE

## 2022-12-20 LAB — LIPASE, BLOOD: Lipase: 30 U/L (ref 11–51)
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
## 2022-12-21 ENCOUNTER — Emergency Department
Admission: EM | Admit: 2022-12-21 | Discharge: 2022-12-21 | Disposition: A | Discharge status: Home / Self Care | Payer: No Typology Code available for payment source | Attending: Emergency Medicine | Admitting: Emergency Medicine

## 2022-12-21 DIAGNOSIS — S9031XA Contusion of right foot, initial encounter: Secondary | ICD-10-CM

## 2022-12-21 MED ORDER — ACETAMINOPHEN 500 MG PO TABS
1000.0000 mg | ORAL_TABLET | ORAL | Status: AC
  Administered 2022-12-21: 1000 mg via ORAL
  Filled 2022-12-21: qty 2
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
## 2023-09-13 ENCOUNTER — Ambulatory Visit (LOCAL_COMMUNITY_HEALTH_CENTER): Payer: Self-pay

## 2023-09-13 VITALS — BP 138/63 | Ht 62.0 in | Wt 198.0 lb

## 2023-09-13 DIAGNOSIS — Z3009 Encounter for other general counseling and advice on contraception: Secondary | ICD-10-CM

## 2023-09-13 DIAGNOSIS — Z3201 Encounter for pregnancy test, result positive: Secondary | ICD-10-CM

## 2023-09-13 LAB — PREGNANCY, URINE: Preg Test, Ur: POSITIVE — AB

## 2023-09-13 MED ORDER — PRENATAL 27-0.8 MG PO TABS
1.0000 | ORAL_TABLET | Freq: Every day | ORAL | Status: AC
Start: 2023-09-13 — End: 2023-12-22

## 2023-09-13 NOTE — Progress Notes (Signed)
 UPT positive. Positive preg packet given and reviewed.   Plans prenatal care at [facility].   The patient was dispensed prenatal vitamins #100 today per SO Dr Lorrin Mais. I provided counseling today regarding the medication. We discussed the medication, the side effects and when to call clinic. Patient given the opportunity to ask questions. Questions answered.    Sent to clerk for new OB appt and presumptive elig/medicaid preg women.   ULG interpreter id [phone] served as interpreter today. Jerel Shepherd, RN

## 2023-09-16 ENCOUNTER — Other Ambulatory Visit: Payer: Self-pay

## 2023-09-16 ENCOUNTER — Other Ambulatory Visit: Payer: Self-pay | Admitting: Licensed Practical Nurse

## 2023-09-16 ENCOUNTER — Telehealth: Payer: Self-pay | Admitting: Licensed Practical Nurse

## 2023-09-16 ENCOUNTER — Emergency Department
Admission: EM | Admit: 2023-09-16 | Discharge: 2023-09-16 | Disposition: A | Discharge status: Home / Self Care | Payer: Self-pay | Attending: Emergency Medicine | Admitting: Emergency Medicine

## 2023-09-16 ENCOUNTER — Emergency Department: Payer: Self-pay

## 2023-09-16 DIAGNOSIS — O4691 Antepartum hemorrhage, unspecified, first trimester: Secondary | ICD-10-CM | POA: Insufficient documentation

## 2023-09-16 DIAGNOSIS — O4690 Antepartum hemorrhage, unspecified, unspecified trimester: Secondary | ICD-10-CM

## 2023-09-16 DIAGNOSIS — O3680X0 Pregnancy with inconclusive fetal viability, not applicable or unspecified: Secondary | ICD-10-CM | POA: Insufficient documentation

## 2023-09-16 DIAGNOSIS — O23591 Infection of other part of genital tract in pregnancy, first trimester: Secondary | ICD-10-CM | POA: Insufficient documentation

## 2023-09-16 DIAGNOSIS — B9689 Other specified bacterial agents as the cause of diseases classified elsewhere: Secondary | ICD-10-CM | POA: Insufficient documentation

## 2023-09-16 DIAGNOSIS — Z3A01 Less than 8 weeks gestation of pregnancy: Secondary | ICD-10-CM | POA: Insufficient documentation

## 2023-09-16 LAB — CBC
HCT: 35.8 % — ABNORMAL LOW (ref 36.0–46.0)
Hemoglobin: 11.8 g/dL — ABNORMAL LOW (ref 12.0–15.0)
MCH: 29.5 pg (ref 26.0–34.0)
MCHC: 33 g/dL (ref 30.0–36.0)
MCV: 89.5 fL (ref 80.0–100.0)
Platelets: 275 10*3/uL (ref 150–400)
RBC: 4 MIL/uL (ref 3.87–5.11)
RDW: 13.4 % (ref 11.5–15.5)
WBC: 7 10*3/uL (ref 4.0–10.5)
nRBC: 0 % (ref 0.0–0.2)

## 2023-09-16 LAB — URINALYSIS, ROUTINE W REFLEX MICROSCOPIC
Bilirubin Urine: NEGATIVE
Glucose, UA: NEGATIVE mg/dL
Ketones, ur: NEGATIVE mg/dL
Leukocytes,Ua: NEGATIVE
Nitrite: NEGATIVE
Protein, ur: NEGATIVE mg/dL
Specific Gravity, Urine: 1.004 — ABNORMAL LOW (ref 1.005–1.030)
pH: 6 (ref 5.0–8.0)

## 2023-09-16 LAB — BASIC METABOLIC PANEL
Anion gap: 8 (ref 5–15)
BUN: 8 mg/dL (ref 6–20)
CO2: 24 mmol/L (ref 22–32)
Calcium: 8.9 mg/dL (ref 8.9–10.3)
Chloride: 103 mmol/L (ref 98–111)
Creatinine, Ser: 0.53 mg/dL (ref 0.44–1.00)
GFR, Estimated: >60 mL/min (ref >60)
Glucose, Bld: 102 mg/dL — ABNORMAL HIGH (ref 70–99)
Potassium: 3.9 mmol/L (ref 3.5–5.1)
Sodium: 135 mmol/L (ref 135–145)

## 2023-09-16 LAB — WET PREP, GENITAL
Sperm: NONE SEEN
Trich, Wet Prep: NONE SEEN
WBC, Wet Prep HPF POC: <10 (ref <10)
Yeast Wet Prep HPF POC: NONE SEEN

## 2023-09-16 LAB — CHLAMYDIA/NGC RT PCR (ARMC ONLY)
Chlamydia Tr: NOT DETECTED
N gonorrhoeae: NOT DETECTED

## 2023-09-16 LAB — HCG, QUANTITATIVE, PREGNANCY: hCG, Beta Chain, Quant, S: 1024 m[IU]/mL — ABNORMAL HIGH (ref <5)

## 2023-09-16 LAB — PREGNANCY, URINE: Preg Test, Ur: POSITIVE — AB

## 2023-09-16 LAB — ABO/RH: ABO/RH(D): A POS

## 2023-09-16 MED ORDER — METRONIDAZOLE 250 MG PO TABS: 250.0000 mg | ORAL_TABLET | Freq: Three times a day (TID) | ORAL | 0 refills | Status: AC

## 2023-09-16 NOTE — ED Triage Notes (Signed)
 Pt to ED for vaginal bleeding, [redacted] weeks pregnant. Bleeding started today. Hx preeclampsia

## 2023-09-16 NOTE — Progress Notes (Signed)
 Pt seen in ED 3/21 for pregnancy of unknown location, needs repeat beta on Monday.  Fu apt for Tuesday or Wednesday. Urine culture pending. Carie Caddy, CNM  [facility]  09/16/23  9:30 AM '

## 2023-09-16 NOTE — Telephone Encounter (Signed)
 this pt is spanish speaking, she has a pregnancy of unknown location, she needs a lab only beta on Monday and in person visit to discuss results on Tuesday or Wednesday Reached out to pt via interpreter to schedule the appts.  Left message for pt to call back.

## 2023-09-16 NOTE — Discharge Instructions (Addendum)
 We are starting you on Flagyl for potential bacterial vaginosis.  You to follow-up with OB/GYN.  Please call them state that you are in the emergency room and need a lab hCG follow-up on Monday and an appointment for Tuesday to discuss these results.  In the meantime you develop worsening pain, bleeding more than a pad an hour or any other concerns then she should return to the ER for repeat testing here.  Your urine culture is pending.   No intrauterine gestational sac, yolk sac, fetal pole, or cardiac activity visualized. Differential considerations include intrauterine gestation too early to be sonographically visualized, spontaneous abortion, or ectopic pregnancy. Consider follow-up ultrasound in 14 days and serial quantitative beta HCG follow-up.

## 2023-09-16 NOTE — ED Provider Notes (Addendum)
 [facility] Provider Note    Event Date/Time   First MD Initiated Contact with Patient 09/16/23 [phone]     (approximate)   History   Vaginal Bleeding   HPI  Judy Singh is a 38 y.o. female who comes in with vaginal bleeding.  Patient is currently about [redacted] weeks pregnant.  She has some bleeding that started today.  Patient states that this is her fourth pregnancy.  She states she has had 3 prior pregnancies level resulted in live birth.  She reports having some issues with preeclampsia.  She reports that she is about [redacted] weeks pregnant.  This is a new sexual partner.  She denies any vaginal discharge.  She reports that yesterday she had a little bit of pubic pressure and today she had some bleeding about the same or less than a period.  Denies changing her pad every hour.   Physical Exam   Triage Vital Signs: ED Triage Vitals  Encounter Vitals Group     BP 09/16/23 0710 122/88     Systolic BP Percentile --      Diastolic BP Percentile --      Pulse Rate 09/16/23 0710 73     Resp 09/16/23 0710 16     Temp 09/16/23 0710 98.2 F (36.8 C)     Temp src --      SpO2 09/16/23 0710 96 %     Weight 09/16/23 0714 196 lb 3.4 oz (89 kg)     Height 09/16/23 0714 5\' 2"  (1.575 m)     Head Circumference --      Peak Flow --      Pain Score 09/16/23 0714 0     Pain Loc --      Pain Education --      Exclude from Growth Chart --     Most recent vital signs: Vitals:   09/16/23 0710  BP: 122/88  Pulse: 73  Resp: 16  Temp: 98.2 F (36.8 C)  SpO2: 96%     General: Awake, no distress.  CV:  Good peripheral perfusion.  Resp:  Normal effort.  Abd:  No distention.  Soft and nontender, no rebound, no guarding Other:     ED Results / Procedures / Treatments   Labs (all labs ordered are listed, but only abnormal results are displayed) Labs Reviewed  CBC - Abnormal; Notable for the following components:      Result Value   Hemoglobin 11.8 (*)     HCT 35.8 (*)    All other components within normal limits  BASIC METABOLIC PANEL  HCG, QUANTITATIVE, PREGNANCY  URINALYSIS, ROUTINE W REFLEX MICROSCOPIC  POC URINE PREG, ED  ABO/RH   RADIOLOGY I have reviewed the [place] personally and interpreted no IUP   PROCEDURES:  Critical Care performed: No  Procedures   MEDICATIONS ORDERED IN ED: Medications - No data to display   IMPRESSION / MDM / ASSESSMENT AND PLAN / ED COURSE  I reviewed the triage vital signs and the nursing notes.   Patient's presentation is most consistent with acute presentation with potential threat to life or bodily function.  Differential is ectopic, miscarriage, threatened miscarriage patient declined anything for pain.  Will get ultrasound, labs, UA to further evaluate.  Discussed pelvic exam but patient is opted to self swab.  No upper abdominal pain to suggest cholecystitis, pancreatitis.  CBC shows slightly low hemoglobin 11.8 Present test was positive urine with some mild amount of white  cells in it BMP is reassuring.   hCG is over thousand Patient is a positive no indication for RhoGAM  Ultrasound with no evidence of fetus.  Discussed the case with Carie Caddy from OB/GYN who stated that patient should call to have a lab scheduled on Monday for hCG and a follow-up on Tuesday or Wednesday.  Will also discussed with patient return precautions in regards to ectopic pregnancy.  We discussed her urine analysis with OB and okay with holding on antibiotics and will send for culture but no indication for antibiotics at this time will place on Flagyl for her BV   9:39 AM abdominal exam remains soft and nontender overall reassuring.  Low suspicion for ectopic at this time but did discuss with patient the need for close follow-up and return precautions in regards to this.  Spanish interpretor was used.   FINAL CLINICAL IMPRESSION(S) / ED DIAGNOSES   Final diagnoses:  Pregnancy of unknown anatomic location   Bacterial vaginosis in pregnancy  Vaginal bleeding in pregnancy     Rx / DC Orders   ED Discharge Orders          Ordered    metroNIDAZOLE (FLAGYL) 250 MG tablet  3 times daily        09/16/23 9563             Note:  This document was prepared using Dragon voice recognition software and may include unintentional dictation errors.   Concha Se, MD 09/16/23 8756    Concha Se, MD 09/16/23 [phone]

## 2023-09-17 LAB — URINE CULTURE: Culture: <10000 — AB

## 2023-09-19 ENCOUNTER — Encounter: Payer: Self-pay | Admitting: Certified Nurse Midwife

## 2023-09-19 ENCOUNTER — Ambulatory Visit (INDEPENDENT_AMBULATORY_CARE_PROVIDER_SITE_OTHER): Payer: Self-pay | Admitting: Certified Nurse Midwife

## 2023-09-19 VITALS — BP 124/81 | HR 75 | Ht 62.0 in | Wt 201.9 lb

## 2023-09-19 DIAGNOSIS — Z3A01 Less than 8 weeks gestation of pregnancy: Secondary | ICD-10-CM

## 2023-09-19 DIAGNOSIS — N939 Abnormal uterine and vaginal bleeding, unspecified: Secondary | ICD-10-CM | POA: Insufficient documentation

## 2023-09-19 DIAGNOSIS — O209 Hemorrhage in early pregnancy, unspecified: Secondary | ICD-10-CM

## 2023-09-19 NOTE — Progress Notes (Signed)
    GYNECOLOGY PROGRESS NOTE  Subjective:    Patient ID: Judy Singh, female    DOB: 1985/07/01, 38 y.o.   MRN: [id number]  HPI  Patient is a 38 y.o. [phone] female who presents for evlaution of vaginal bleeding with postive pregnancy test. Judy Singh was seen in the ED on 3/21 for bleeding. LMP would date pregnancy at [redacted]w[redacted]d. US showed no evidence of pregnancy. HCG 1024. Discharged with plan for follow up for labs and imaging. Patient reports light bleeding since ED visit.   The following portions of the patient's history were reviewed and updated as appropriate: allergies, current medications, past family history, past medical history, past social history, past surgical history, and problem list.  Review of Systems Pertinent items are noted in HPI.   Objective:   Blood pressure 124/81, pulse 75, height 5\' 2"  (1.575 m), weight 201 lb 14.4 oz (91.6 kg), last menstrual period 07/31/2023. Body mass index is 36.93 kg/m. General appearance: alert and cooperative   Assessment:   1. Bleeding in early pregnancy   2. Vaginal bleeding      Plan:   1. Bleeding in early pregnancy (Primary) -Reviewed ED findings with patient and plan for HCG today and [place] 10-14 days after ED [place].  -This was a desired pregnancy. Patient coping well today. Discussed most miscarriages have an unknown origin. Although we do not know for sure this is a loss, it is most likely since the bleeding has continued.  - US OB LESS THAN 14 WEEKS WITH OB TRANSVAGINAL; Future - Beta hCG quant (ref lab); Future

## 2023-09-20 LAB — BETA HCG QUANT (REF LAB): hCG Quant: 49 m[IU]/mL

## 2023-09-28 ENCOUNTER — Ambulatory Visit (INDEPENDENT_AMBULATORY_CARE_PROVIDER_SITE_OTHER): Payer: Self-pay

## 2023-09-28 ENCOUNTER — Other Ambulatory Visit: Payer: Self-pay | Admitting: Certified Nurse Midwife

## 2023-09-28 DIAGNOSIS — O209 Hemorrhage in early pregnancy, unspecified: Secondary | ICD-10-CM

## 2023-09-28 DIAGNOSIS — N939 Abnormal uterine and vaginal bleeding, unspecified: Secondary | ICD-10-CM

## 2023-09-28 DIAGNOSIS — O034 Incomplete spontaneous abortion without complication: Secondary | ICD-10-CM
